# Patient Record
Sex: Male | Born: 1960 | Race: White | Hispanic: No | Marital: Married | Smoking: Never smoker
Health system: Southern US, Community
[De-identification: ages and names within clinical notes are randomized; demographics above are authoritative.]

## PROBLEM LIST (undated history)

## (undated) DIAGNOSIS — I10 Essential (primary) hypertension: Secondary | ICD-10-CM

---

## 2021-07-01 ENCOUNTER — Inpatient Hospital Stay (HOSPITAL_COMMUNITY): Payer: BLUE CROSS/BLUE SHIELD

## 2021-07-01 ENCOUNTER — Emergency Department (HOSPITAL_COMMUNITY): Payer: BLUE CROSS/BLUE SHIELD

## 2021-07-01 ENCOUNTER — Other Ambulatory Visit: Payer: Self-pay

## 2021-07-01 ENCOUNTER — Inpatient Hospital Stay (HOSPITAL_COMMUNITY)
Admission: EM | Admit: 2021-07-01 | Discharge: 2021-07-03 | DRG: 065 | Disposition: A | Discharge status: Home / Self Care | Payer: BLUE CROSS/BLUE SHIELD | Attending: Internal Medicine | Admitting: Internal Medicine

## 2021-07-01 ENCOUNTER — Encounter (HOSPITAL_COMMUNITY): Payer: Self-pay | Admitting: Internal Medicine

## 2021-07-01 DIAGNOSIS — R29810 Facial weakness: Secondary | ICD-10-CM | POA: Diagnosis present

## 2021-07-01 DIAGNOSIS — I63531 Cerebral infarction due to unspecified occlusion or stenosis of right posterior cerebral artery: Secondary | ICD-10-CM | POA: Diagnosis not present

## 2021-07-01 DIAGNOSIS — R471 Dysarthria and anarthria: Secondary | ICD-10-CM | POA: Diagnosis present

## 2021-07-01 DIAGNOSIS — I10 Essential (primary) hypertension: Secondary | ICD-10-CM | POA: Diagnosis not present

## 2021-07-01 DIAGNOSIS — D751 Secondary polycythemia: Secondary | ICD-10-CM | POA: Diagnosis not present

## 2021-07-01 DIAGNOSIS — G8194 Hemiplegia, unspecified affecting left nondominant side: Secondary | ICD-10-CM | POA: Diagnosis present

## 2021-07-01 DIAGNOSIS — R9389 Abnormal findings on diagnostic imaging of other specified body structures: Secondary | ICD-10-CM | POA: Diagnosis not present

## 2021-07-01 DIAGNOSIS — R29705 NIHSS score 5: Secondary | ICD-10-CM | POA: Diagnosis present

## 2021-07-01 DIAGNOSIS — I639 Cerebral infarction, unspecified: Secondary | ICD-10-CM | POA: Diagnosis present

## 2021-07-01 DIAGNOSIS — Z823 Family history of stroke: Secondary | ICD-10-CM

## 2021-07-01 DIAGNOSIS — E871 Hypo-osmolality and hyponatremia: Secondary | ICD-10-CM | POA: Diagnosis present

## 2021-07-01 DIAGNOSIS — Z79899 Other long term (current) drug therapy: Secondary | ICD-10-CM

## 2021-07-01 DIAGNOSIS — Z20822 Contact with and (suspected) exposure to covid-19: Secondary | ICD-10-CM | POA: Diagnosis present

## 2021-07-01 DIAGNOSIS — E785 Hyperlipidemia, unspecified: Secondary | ICD-10-CM | POA: Diagnosis present

## 2021-07-01 HISTORY — DX: Essential (primary) hypertension: I10

## 2021-07-01 LAB — HEMOGLOBIN A1C
Hgb A1c MFr Bld: 4.5 % — ABNORMAL LOW (ref 4.8–5.6)
Mean Plasma Glucose: 82.45 mg/dL

## 2021-07-01 LAB — LIPID PANEL
Cholesterol: 143 mg/dL (ref 0–200)
HDL: 25 mg/dL — ABNORMAL LOW (ref >40)
LDL Cholesterol: 88 mg/dL (ref 0–99)
Total CHOL/HDL Ratio: 5.7 RATIO
Triglycerides: 150 mg/dL — ABNORMAL HIGH (ref <150)
VLDL: 30 mg/dL (ref 0–40)

## 2021-07-01 LAB — URINALYSIS, ROUTINE W REFLEX MICROSCOPIC
Bilirubin Urine: NEGATIVE
Glucose, UA: NEGATIVE mg/dL
Hgb urine dipstick: NEGATIVE
Ketones, ur: NEGATIVE mg/dL
Leukocytes,Ua: NEGATIVE
Nitrite: NEGATIVE
Protein, ur: NEGATIVE mg/dL
Specific Gravity, Urine: 1.005 (ref 1.005–1.030)
pH: 7 (ref 5.0–8.0)

## 2021-07-01 LAB — I-STAT CHEM 8, ED
BUN: 18 mg/dL (ref 8–23)
Calcium, Ion: 1.13 mmol/L — ABNORMAL LOW (ref 1.15–1.40)
Chloride: 103 mmol/L (ref 98–111)
Creatinine, Ser: 1.1 mg/dL (ref 0.61–1.24)
Glucose, Bld: 91 mg/dL (ref 70–99)
HCT: 53 % — ABNORMAL HIGH (ref 39.0–52.0)
Hemoglobin: 18 g/dL — ABNORMAL HIGH (ref 13.0–17.0)
Potassium: 4.1 mmol/L (ref 3.5–5.1)
Sodium: 140 mmol/L (ref 135–145)
TCO2: 30 mmol/L (ref 22–32)

## 2021-07-01 LAB — COMPREHENSIVE METABOLIC PANEL
ALT: 43 U/L (ref 0–44)
AST: 40 U/L (ref 15–41)
Albumin: 4 g/dL (ref 3.5–5.0)
Alkaline Phosphatase: 70 U/L (ref 38–126)
Anion gap: 10 (ref 5–15)
BUN: 15 mg/dL (ref 8–23)
CO2: 26 mmol/L (ref 22–32)
Calcium: 9.3 mg/dL (ref 8.9–10.3)
Chloride: 102 mmol/L (ref 98–111)
Creatinine, Ser: 1.21 mg/dL (ref 0.61–1.24)
GFR, Estimated: >60 mL/min (ref >60)
Glucose, Bld: 94 mg/dL (ref 70–99)
Potassium: 4.1 mmol/L (ref 3.5–5.1)
Sodium: 138 mmol/L (ref 135–145)
Total Bilirubin: 1.4 mg/dL — ABNORMAL HIGH (ref 0.3–1.2)
Total Protein: 7.1 g/dL (ref 6.5–8.1)

## 2021-07-01 LAB — PROTIME-INR
INR: 1.1 (ref 0.8–1.2)
Prothrombin Time: 13.7 seconds (ref 11.4–15.2)

## 2021-07-01 LAB — RESP PANEL BY RT-PCR (FLU A&B, COVID) ARPGX2
Influenza A by PCR: NEGATIVE
Influenza B by PCR: NEGATIVE
SARS Coronavirus 2 by RT PCR: NEGATIVE

## 2021-07-01 LAB — DIFFERENTIAL
Abs Immature Granulocytes: 0.02 10*3/uL (ref 0.00–0.07)
Basophils Absolute: 0.1 10*3/uL (ref 0.0–0.1)
Basophils Relative: 1 %
Eosinophils Absolute: 0.4 10*3/uL (ref 0.0–0.5)
Eosinophils Relative: 5 %
Immature Granulocytes: 0 %
Lymphocytes Relative: 31 %
Lymphs Abs: 2.1 10*3/uL (ref 0.7–4.0)
Monocytes Absolute: 0.5 10*3/uL (ref 0.1–1.0)
Monocytes Relative: 7 %
Neutro Abs: 3.7 10*3/uL (ref 1.7–7.7)
Neutrophils Relative %: 56 %

## 2021-07-01 LAB — ETHANOL: Alcohol, Ethyl (B): <10 mg/dL (ref <10)

## 2021-07-01 LAB — RAPID URINE DRUG SCREEN, HOSP PERFORMED
Amphetamines: NOT DETECTED
Barbiturates: NOT DETECTED
Benzodiazepines: NOT DETECTED
Cocaine: NOT DETECTED
Opiates: NOT DETECTED
Tetrahydrocannabinol: NOT DETECTED

## 2021-07-01 LAB — CBC
HCT: 54 % — ABNORMAL HIGH (ref 39.0–52.0)
Hemoglobin: 18.4 g/dL — ABNORMAL HIGH (ref 13.0–17.0)
MCH: 31.3 pg (ref 26.0–34.0)
MCHC: 34.1 g/dL (ref 30.0–36.0)
MCV: 92 fL (ref 80.0–100.0)
Platelets: 174 10*3/uL (ref 150–400)
RBC: 5.87 MIL/uL — ABNORMAL HIGH (ref 4.22–5.81)
RDW: 12.9 % (ref 11.5–15.5)
WBC: 6.6 10*3/uL (ref 4.0–10.5)
nRBC: 0 % (ref 0.0–0.2)

## 2021-07-01 LAB — CBG MONITORING, ED: Glucose-Capillary: 92 mg/dL (ref 70–99)

## 2021-07-01 LAB — APTT: aPTT: 33 seconds (ref 24–36)

## 2021-07-01 IMAGING — CT CT ANGIO CHEST
2 of 6 series · 19 of 36 positions shown · IV contrast (omnipaque)
Comparison: None.

CLINICAL DATA: Pulmonary embolism suspected, high probability.

EXAM:
CT ANGIOGRAPHY CHEST WITH CONTRAST
TECHNIQUE: Multidetector CT imaging of the chest was performed using the
standard protocol during bolus administration of intravenous
contrast. Multiplanar CT image reconstructions and MIPs were
obtained to evaluate the vascular anatomy.
CONTRAST:  100mL OMNIPAQUE IOHEXOL 350 MG/ML SOLN

[Series 7: pe thins · axial · 0.88mm/px · z∈[+1135,+1385]mm · 18 of 396 slices shown]
[im 20/396  lung]
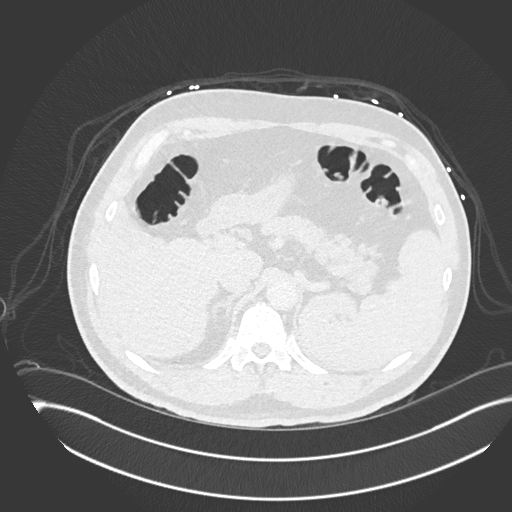
[im 40/396  mediastinal]
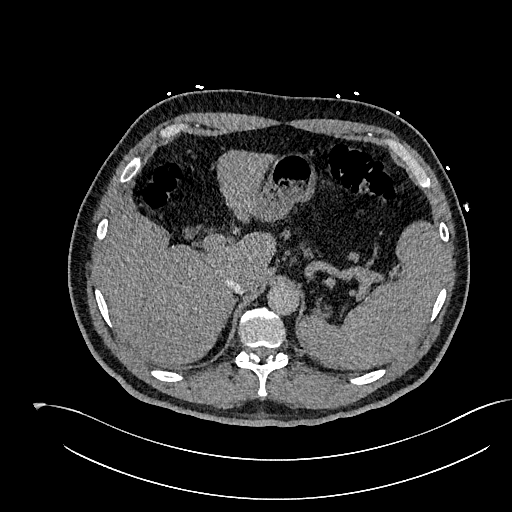
[im 60/396  lung]
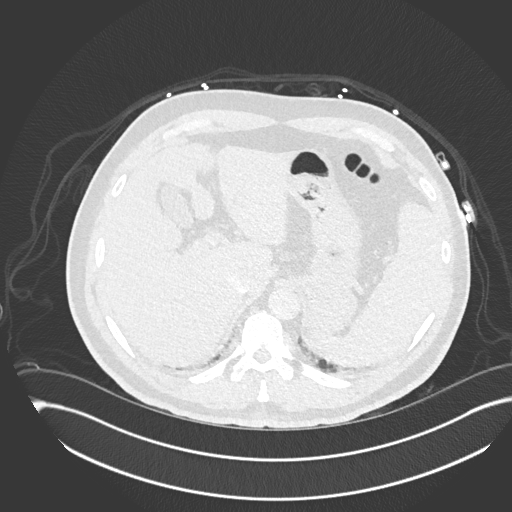
[im 80/396  mediastinal]
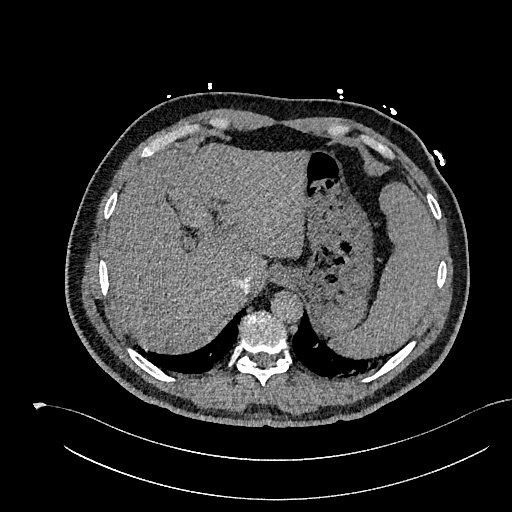
[im 99/396  lung]
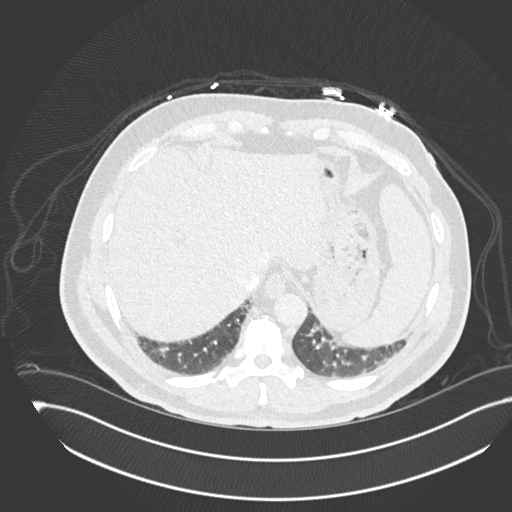
[im 119/396  mediastinal]
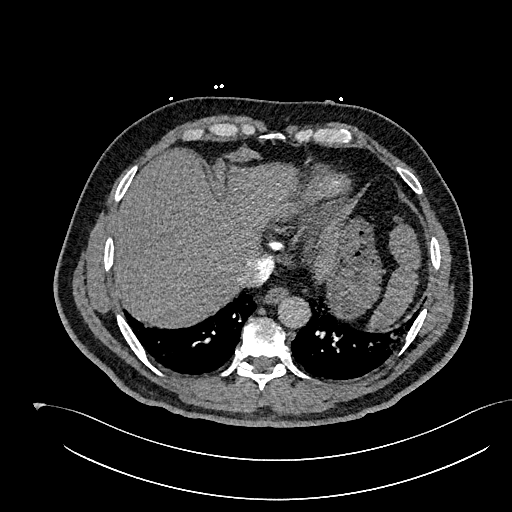
[im 139/396  lung]
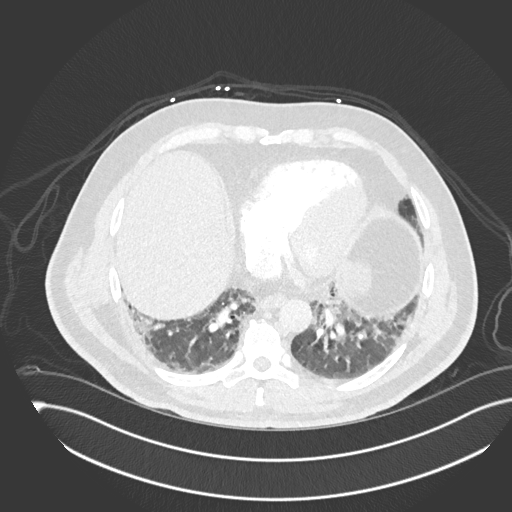
[im 159/396  mediastinal]
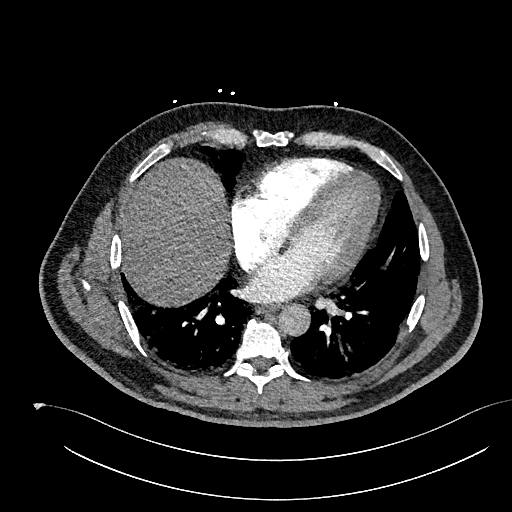
[im 178/396  lung]
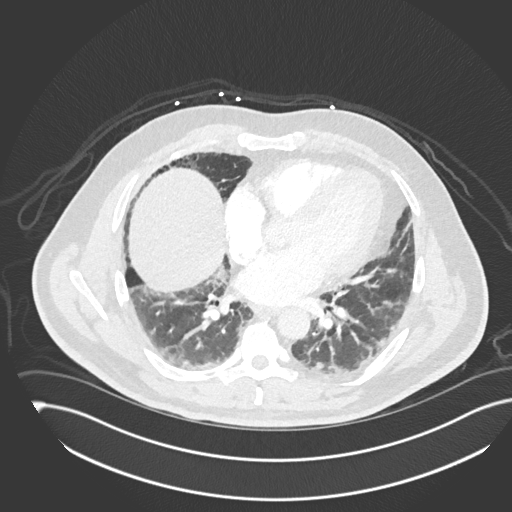
[im 218/396  mediastinal]
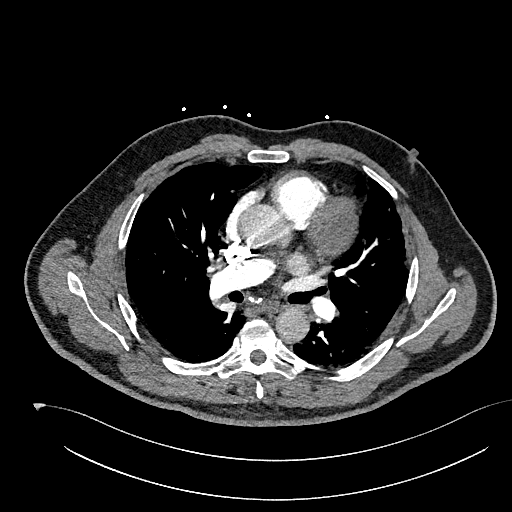
[im 238/396  lung]
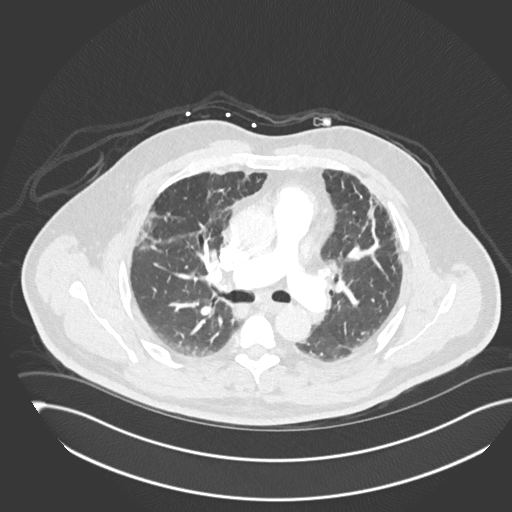
[im 257/396  mediastinal]
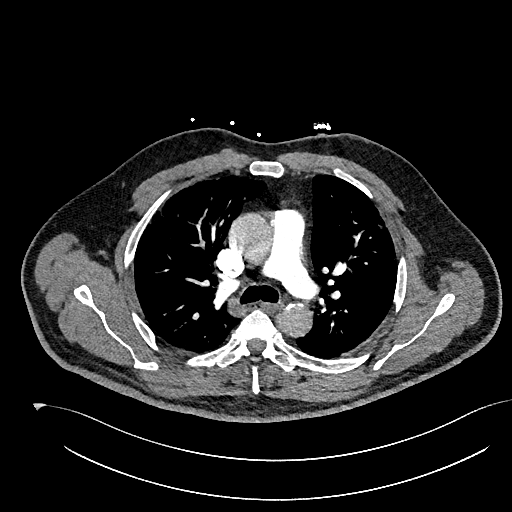
[im 277/396  lung]
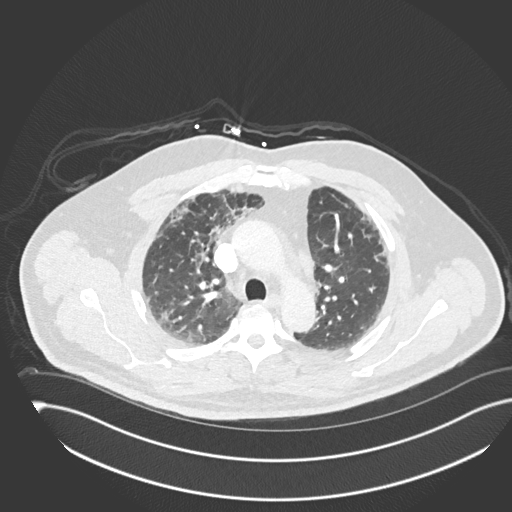
[im 297/396  mediastinal]
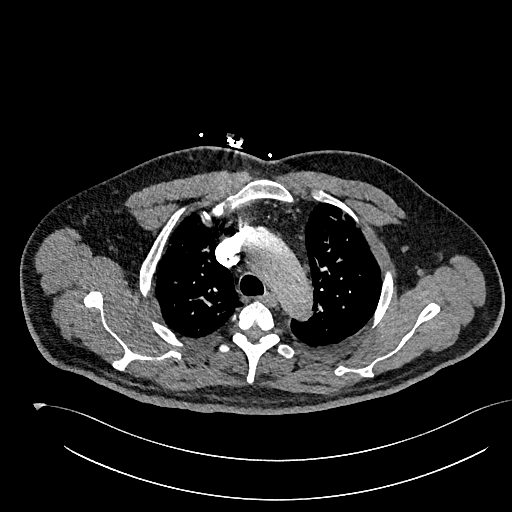
[im 317/396  lung]
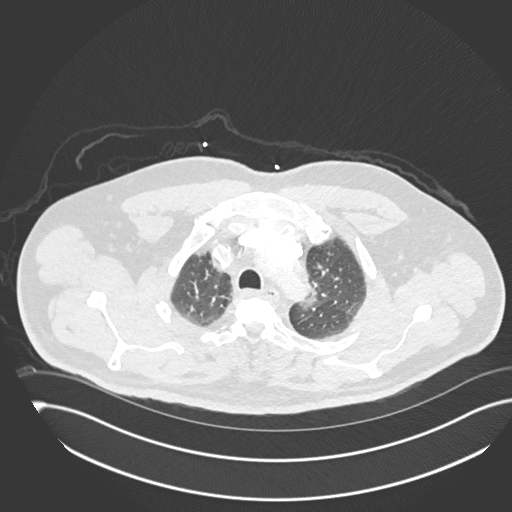
[im 336/396  mediastinal]
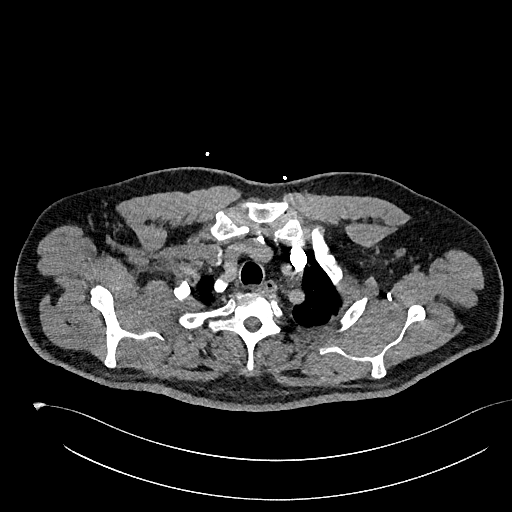
[im 356/396  lung]
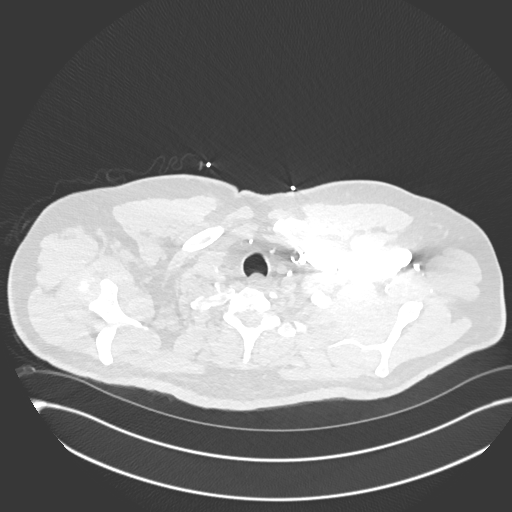
[im 376/396  mediastinal]
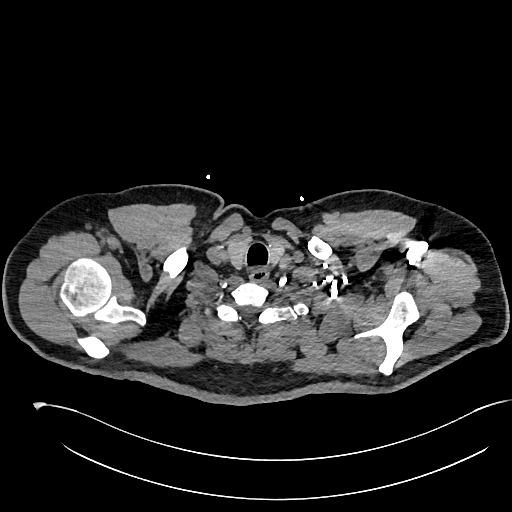

[Series 8: pe 2mm cor · coronal · 0.56mm/px · 1 of 151 slices shown]
[im 76/151  mediastinal]
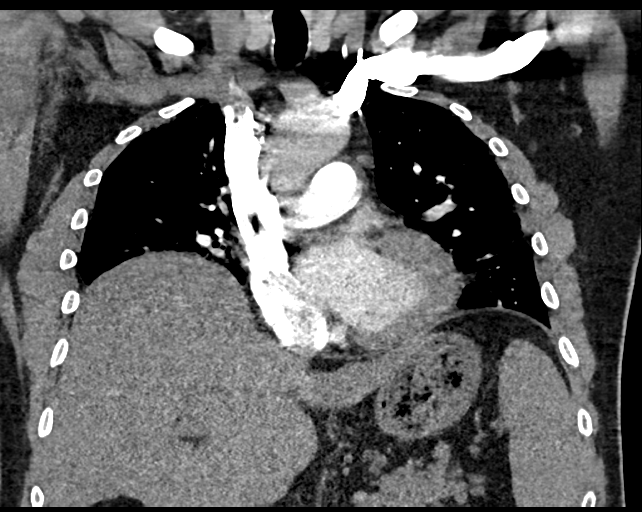

[19 of 36 positions shown; findings below may reference images not displayed]

FINDINGS: Cardiovascular: The heart is normal in size and there is no
pericardial effusion. Mild aortic atherosclerosis without evidence
of aneurysm. The pulmonary trunk is normal in caliber. No definite
evidence of pulmonary embolism. Evaluation of the segmental arteries
is limited due to mixing artifact and respiratory motion.

Mediastinum/Nodes: Calcified nonenlarged lymph nodes are present in
the mediastinum and right hilum. No axillary lymphadenopathy. The
thyroid gland, trachea, and esophagus are within normal limits.

Lungs/Pleura: Scattered ground-glass opacities are noted in the
lungs with a peripheral predominance. No effusion or pneumothorax.

Upper Abdomen: The spleen is enlarged measuring 14.1 cm in length.

Musculoskeletal: Mild degenerative changes in the thoracic spine. No
acute osseous abnormality.

Review of the MIP images confirms the above findings.
IMPRESSION: 1. No definite evidence of pulmonary embolism.
2. Scattered ground-glass opacities in the lungs with a peripheral
predominance, possible infectious or inflammatory pneumonitis.
3. Mild splenomegaly.

## 2021-07-01 IMAGING — MR MR MRA NECK W/O CM
1 of 3 series · 18 of 48 positions shown · non-contrast
Comparison: CT head from the same day.

CLINICAL DATA: Neuro deficit, acute, stroke suspected



[Series 4: sag inhance (id) · sagittal · 1.2mm · 0.47mm/px · 18 of 360 slices shown]
[im 1/360]
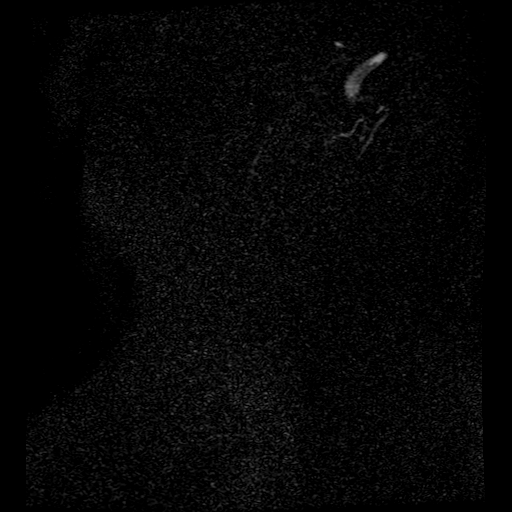
[im 12/360]
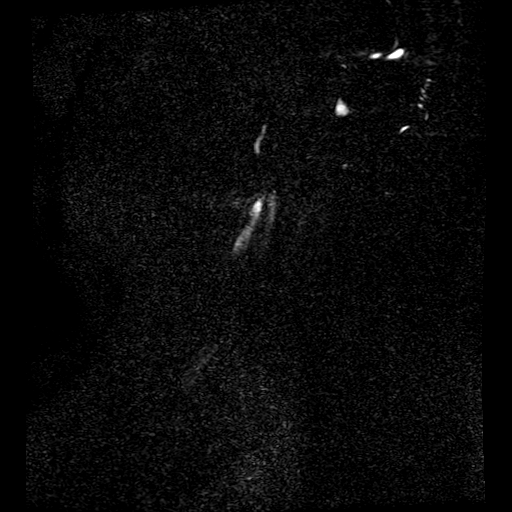
[im 23/360]
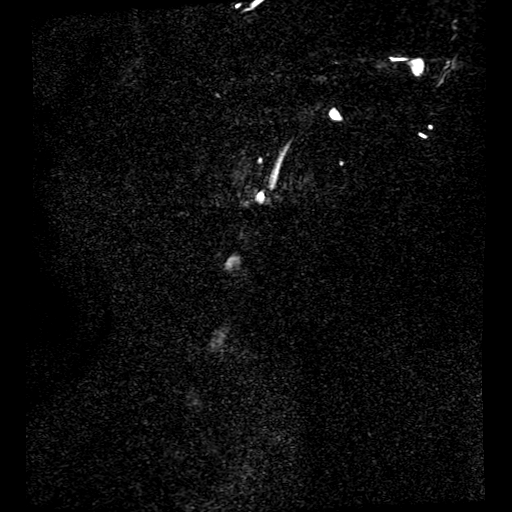
[im 34/360]
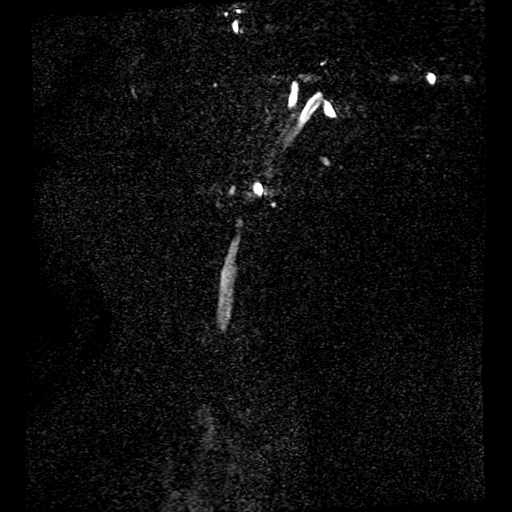
[im 45/360]
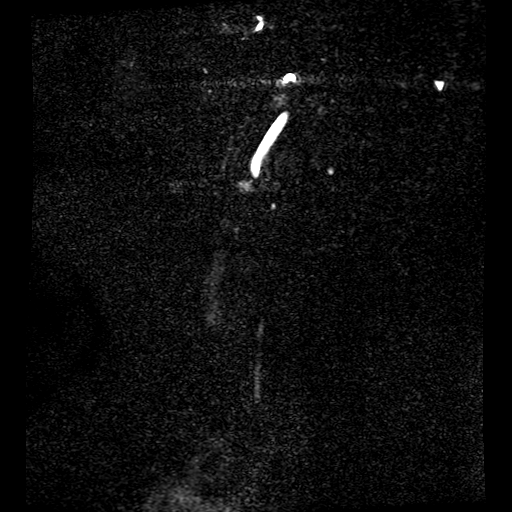
[im 57/360]
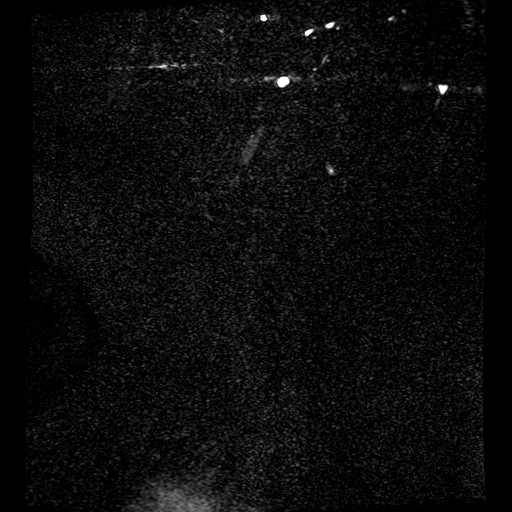
[im 68/360]
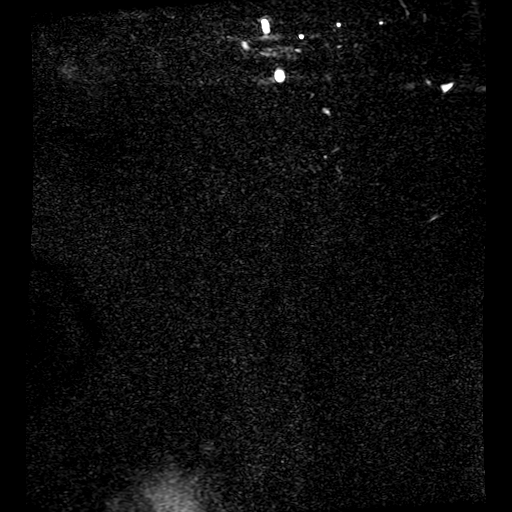
[im 79/360]
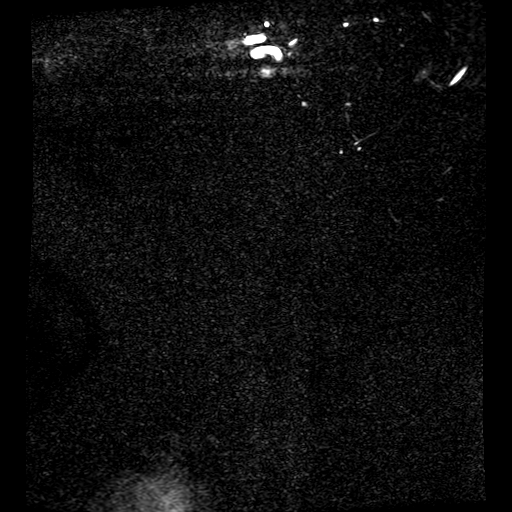
[im 90/360]
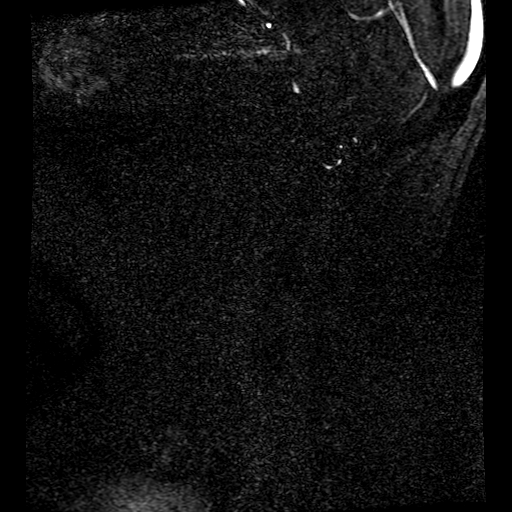
[im 101/360]
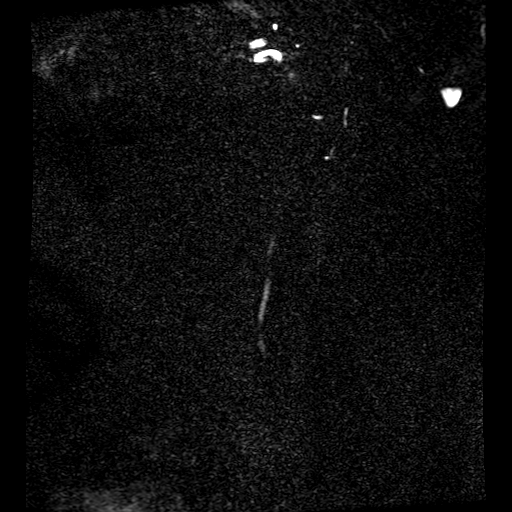
[im 113/360]
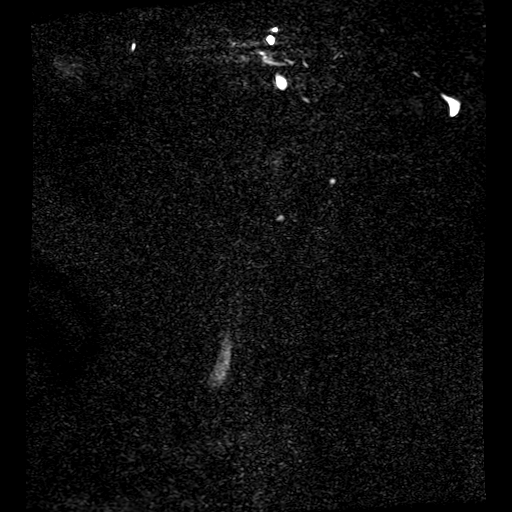
[im 158/360]
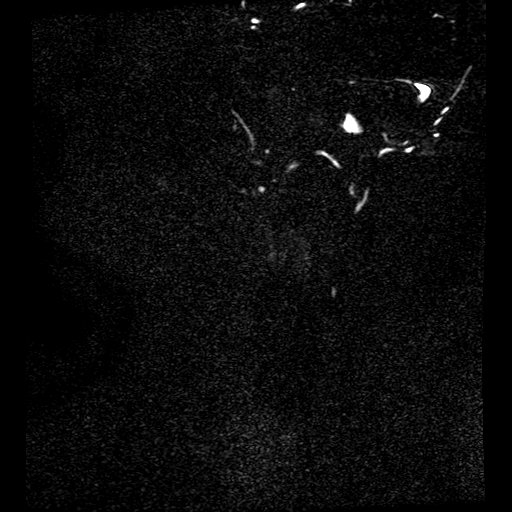
[im 180/360]
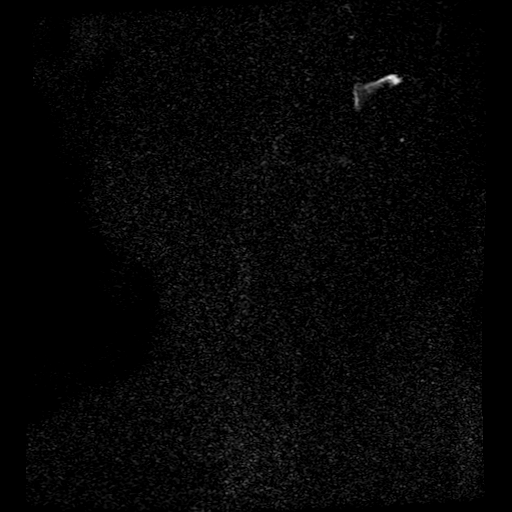
[im 202/360]
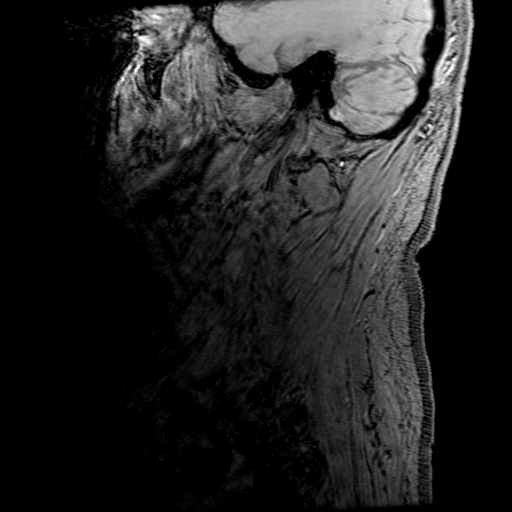
[im 247/360]
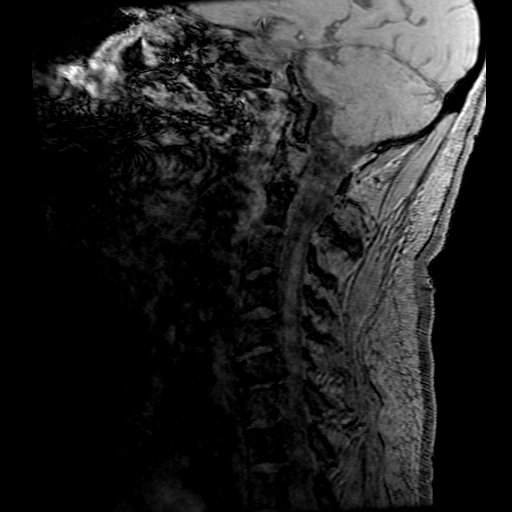
[im 292/360]
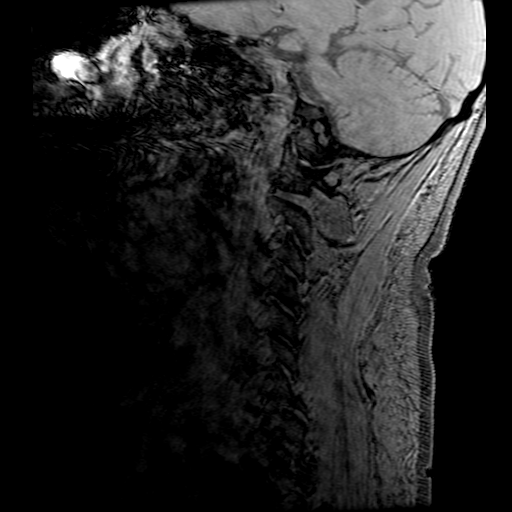
[im 303/360]
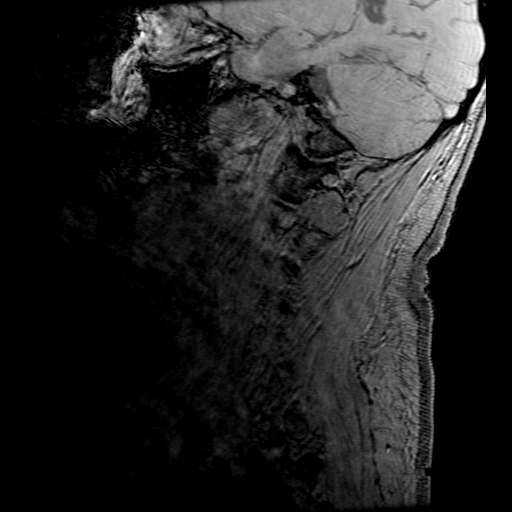
[im 337/360]
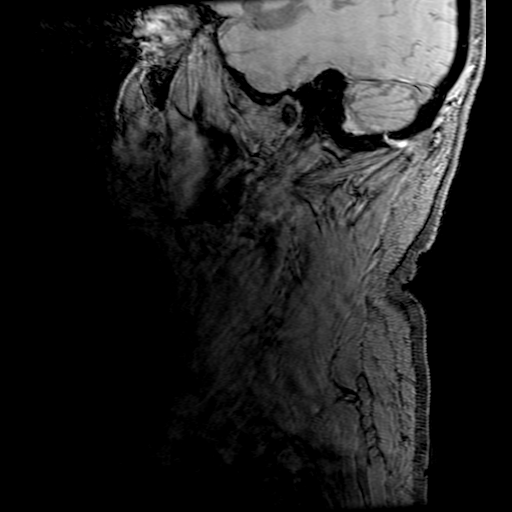

[18 of 48 positions shown; findings below may reference images not displayed]

FINDINGS: MRI HEAD FINDINGS

Brain: Multiple small acute infarcts within the right PCA territory,
including the right thalamus and occipital lobe. No hydrocephalus,
acute hemorrhage, midline shift, mass lesion, or visible extra-axial
fluid collection mild atrophy.

Vascular: Detailed below.

Skull and upper cervical spine: Normal marrow signal.

Sinuses/Orbits: Mild paranasal sinus mucosal thickening.
Unremarkable orbits.

Other: No mastoid effusions.

MRA HEAD FINDINGS

Anterior circulation: Bilateral intracranial ICAs, MCAs, and ACAs
are patent without proximal hemodynamically significant stenosis

Posterior circulation: Bilateral intradural vertebral arteries and
basilar artery are patent without significant stenosis. Occlusion of
the right P1 PCA. Left fetal type PCA. Left PCA is patent without
proximal hemodynamically significant stenosis.

MRA NECK FINDINGS

Right carotid system: Nondiagnostic evaluation of the right common
carotid artery origin and proximal common carotid artery due to MRA
technique/artifact. No visible significant (greater than 50%)
stenosis. Approximately 2 mm inferiorly directed outpouching arising
from the supraclinoid right ICA, favor infundibulum over aneurysm
given small vessel which probably originates from the tip.

Left carotid system: Nondiagnostic evaluation of the left common
carotid artery origin and proximal common carotid artery due to MRA
technique/artifact. No visible significant (greater than 50%)
stenosis.

Vertebral arteries: Nondiagnostic evaluation of the proximal
vertebral arteries. Also, nondiagnostic evaluation of the vertebral
arteries at the C1-C2 level due to in plane flow. The visible
vertebral arteries are patent without significant (greater than 50%
stenosis.
IMPRESSION: MRA head:

1. Right P1 PCA occlusion.
2. Approximately 2 mm inferiorly directed outpouching arising from
the supraclinoid right ICA, favor infundibulum over aneurysm given
small vessel near the tip.

MRI:

Multiple small acute infarcts within the right PCA territory,
including the right thalamus and occipital lobe. Slight edema
without mass effect.

MRA neck:

No visible significant (greater than 50%) stenosis with
nondiagnostic evaluation proximally, detailed above.

Findings discussed with Dr. HERLIN via telephone at [DATE].

## 2021-07-01 IMAGING — CT CT HEAD CODE STROKE
3 series · 14 of 47 positions shown, 16 images · non-contrast
Comparison: None.

CLINICAL DATA: Code stroke.

EXAM:
CT HEAD WITHOUT CONTRAST
TECHNIQUE: Contiguous axial images were obtained from the base of the skull
through the vertex without intravenous contrast.

[Series 3: head 5.0 st · axial · 0.44mm/px · z∈[-16,+114]mm · 8 of 32 slices shown, 10 images]
[im 3/32  brain]
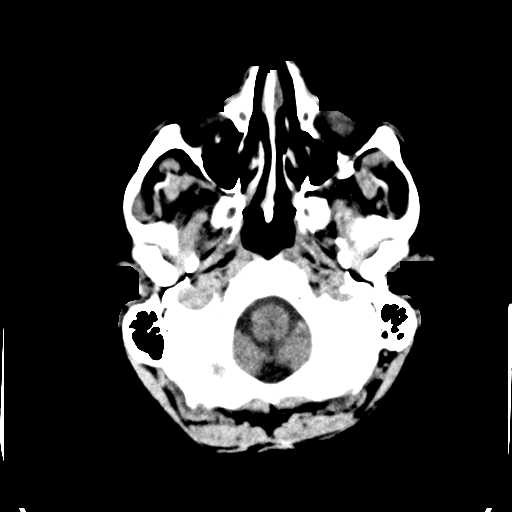
[im 3/32  bone]
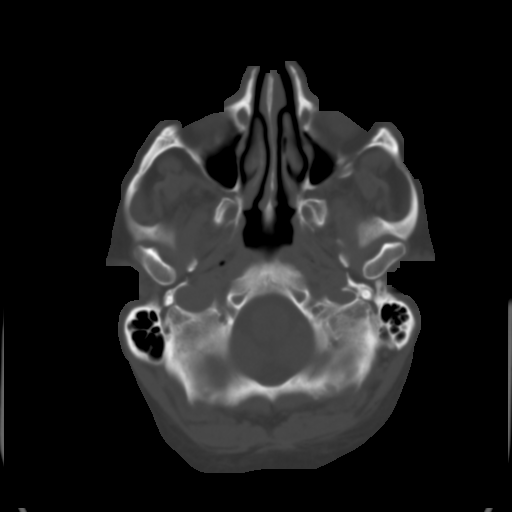
[im 7/32  brain]
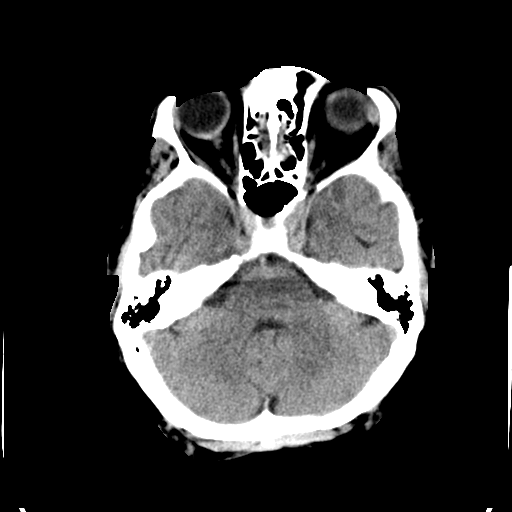
[im 10/32  brain]
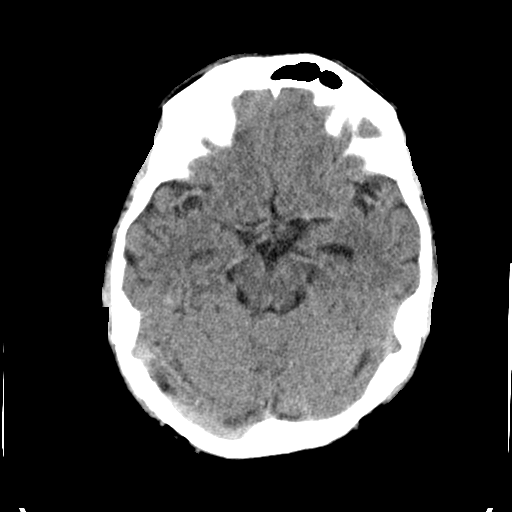
[im 14/32  brain]
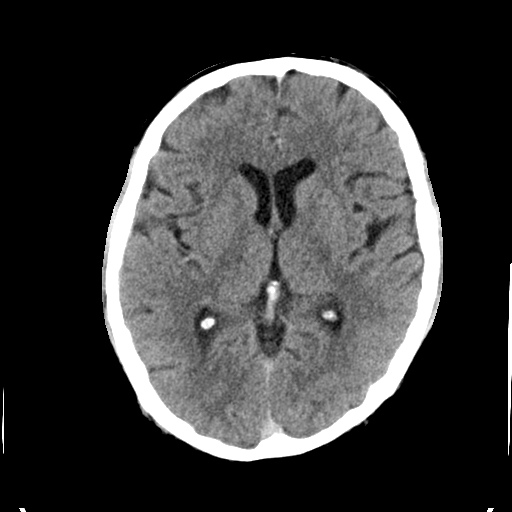
[im 18/32  brain]
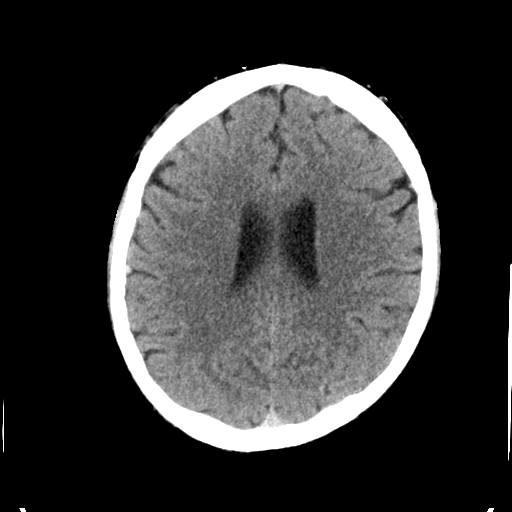
[im 18/32  bone]
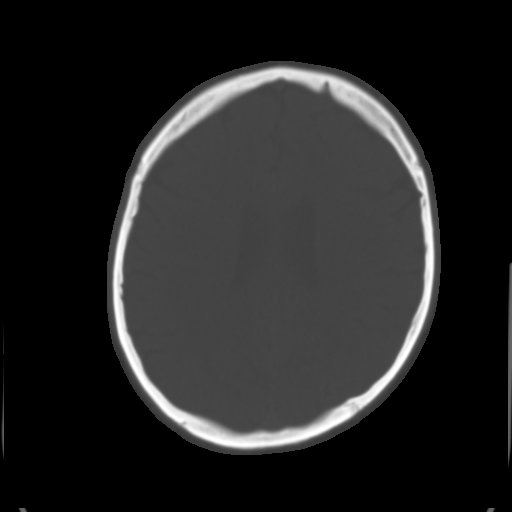
[im 22/32  brain]
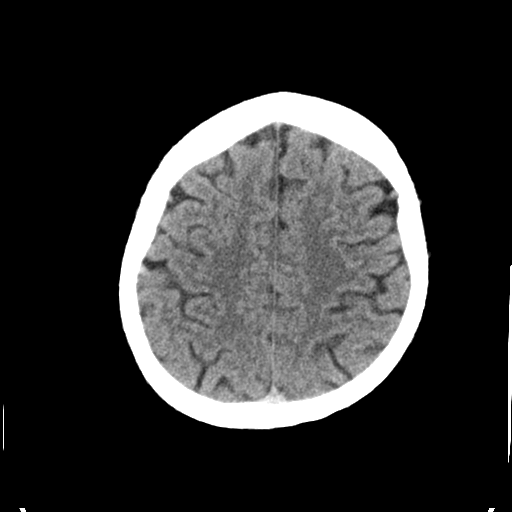
[im 25/32  brain]
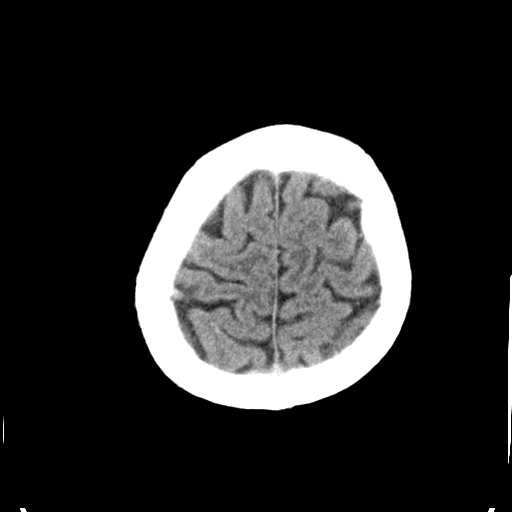
[im 29/32  brain]
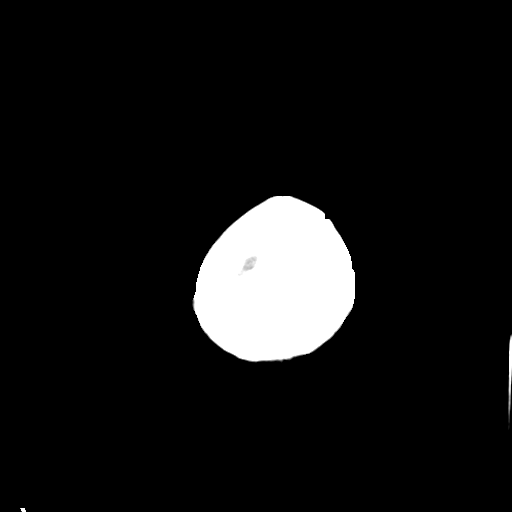

[Series 5: head 3.0 cor st · coronal · 0.36mm/px · 3 of 71 slices shown]
[im 24/71  brain]
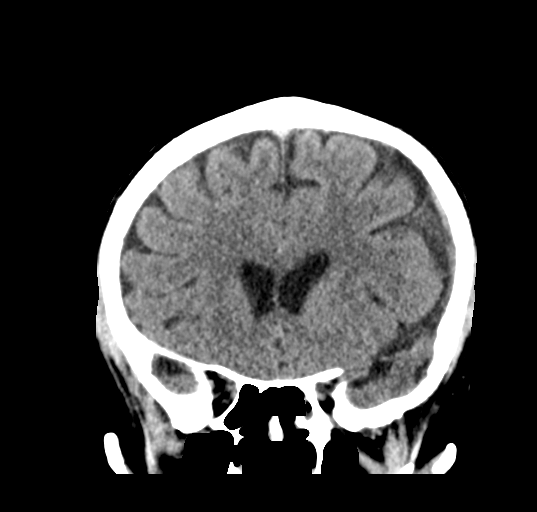
[im 32/71  brain]
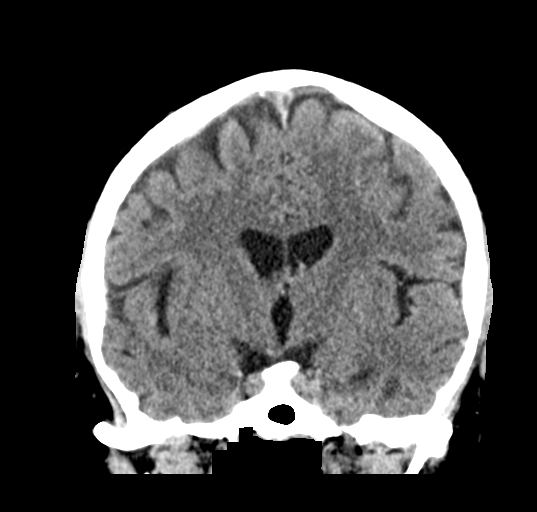
[im 39/71  brain]
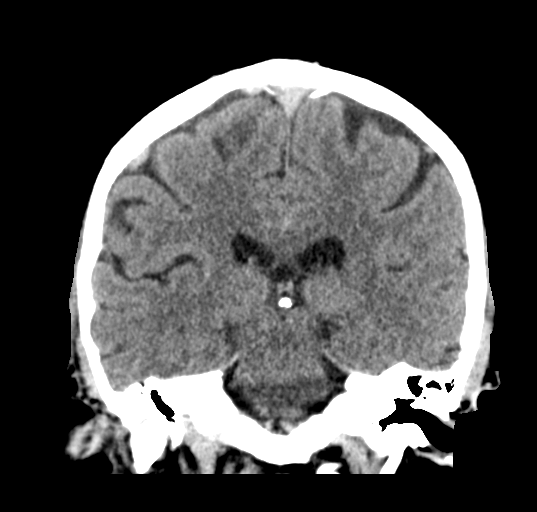

[Series 6: head 3.0 sag st · sagittal · 0.31mm/px · 3 of 67 slices shown]
[im 23/67  brain]
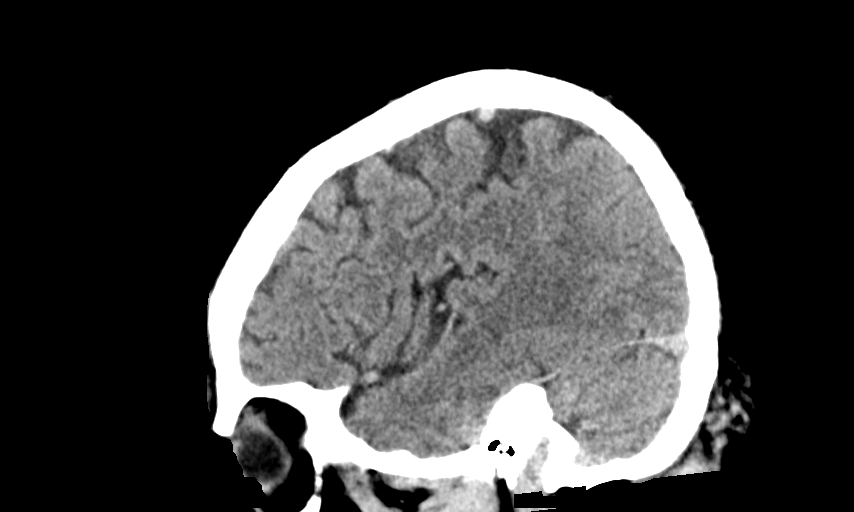
[im 34/67  brain]
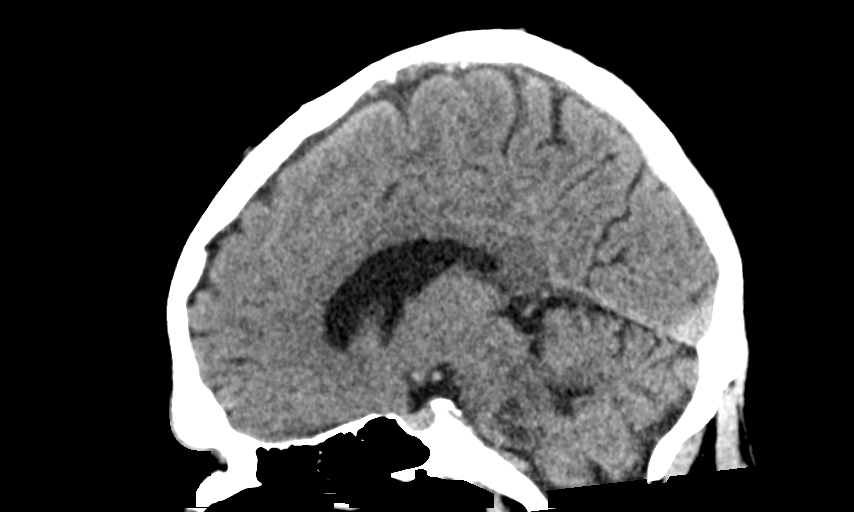
[im 45/67  brain]
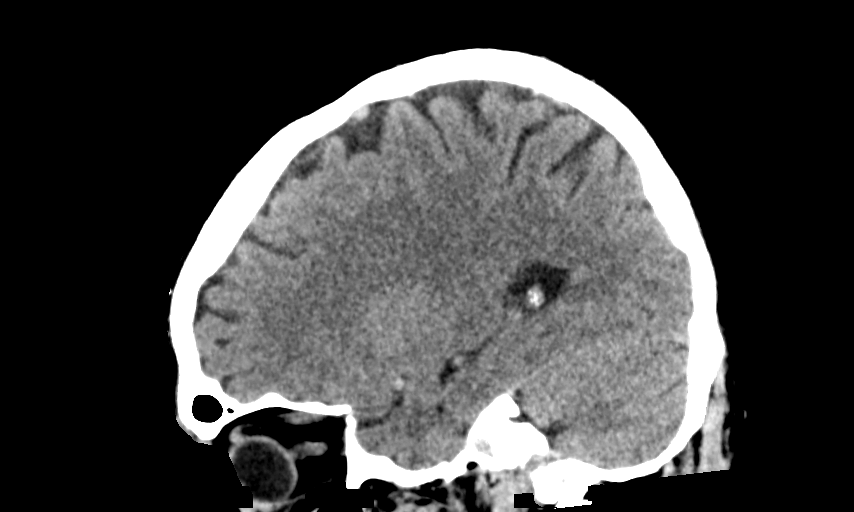

[14 of 47 positions shown; findings below may reference images not displayed]

FINDINGS: Brain: No evidence of acute infarction, hemorrhage, cerebral edema,
mass, mass effect, or midline shift. Ventricles and sulci are normal
for age. No extra-axial fluid collection.

Vascular: No hyperdense vessel or unexpected calcification.

Skull: Normal. Negative for fracture or focal lesion.

Mucosal thickening in the ethmoid air cells. Hypoplastic right
frontal sinus. The orbits are negative.: No acute finding.

Other: The mastoid air cells are well aerated.

ASPECTS (Alberta Stroke Program Early CT Score)

- Ganglionic level infarction (caudate, lentiform nuclei, internal
capsule, insula, M1-M3 cortex): 7

- Supraganglionic infarction (M4-M6 cortex): 3

Total score (0-10 with 10 being normal): 10
IMPRESSION: 1. No acute intracranial process.
2. ASPECTS is 10

Code stroke imaging results were communicated on [DATE] at [DATE]
to provider [[email or web address]] via secure text paging.

## 2021-07-01 IMAGING — DX DG CHEST 1V PORT
1 series · 1 of 1 positions shown · non-contrast
Comparison: None.

CLINICAL DATA: Dyspnea

EXAM:
PORTABLE CHEST 1 VIEW

[chest ap]
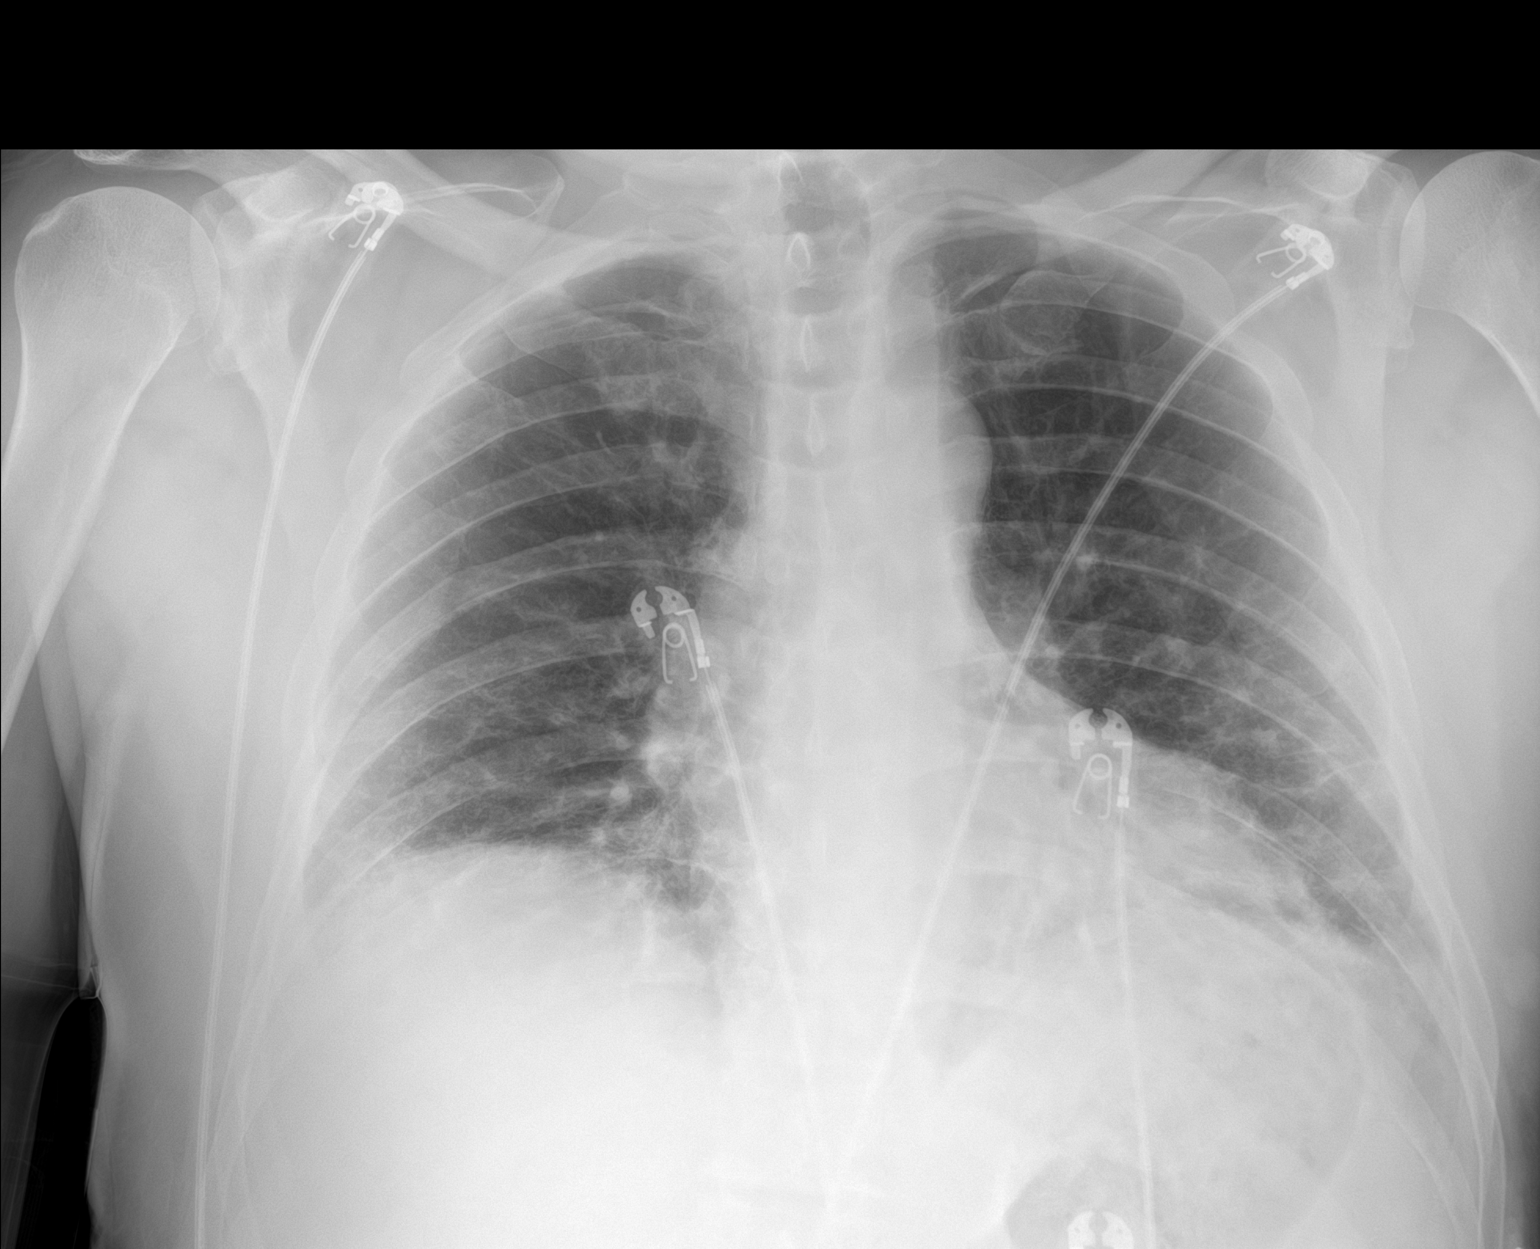

[1 of 1 positions shown; findings below may reference images not displayed]

FINDINGS: The lungs are symmetrically expanded. Asymmetric bibasilar
reticulonodular infiltrates are present, more severe at the left
lung base, likely infectious or inflammatory in the acute setting.
No pneumothorax or pleural effusion. Cardiac size within normal
limits. No acute bone abnormality.
IMPRESSION: Bibasilar reticulonodular infiltrates, likely infectious or
inflammatory. Follow-up examination in 3-4 weeks may be helpful in
documenting resolution. If persistent, these may be better assessed
with high-resolution CT examination at that point.

## 2021-07-01 IMAGING — MR MR MRA HEAD W/O CM
2 series · 18 of 48 positions shown · non-contrast
Comparison: CT head from the same day.

CLINICAL DATA: Neuro deficit, acute, stroke suspected



[Series 2: ax (id) · axial · 1.0mm · 0.43mm/px · z∈[-98,-6]mm · 17 of 210 slices shown]
[im 1/210]
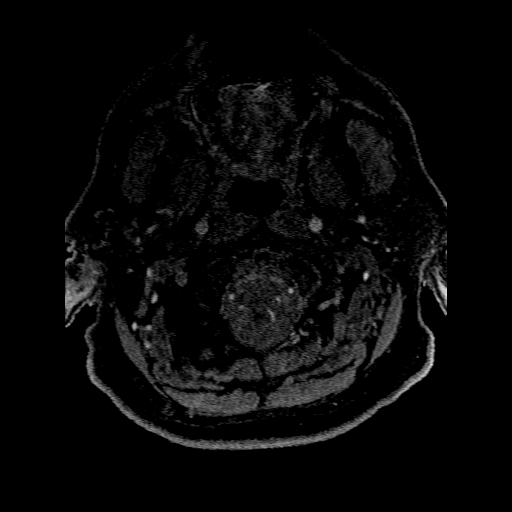
[im 5/210]
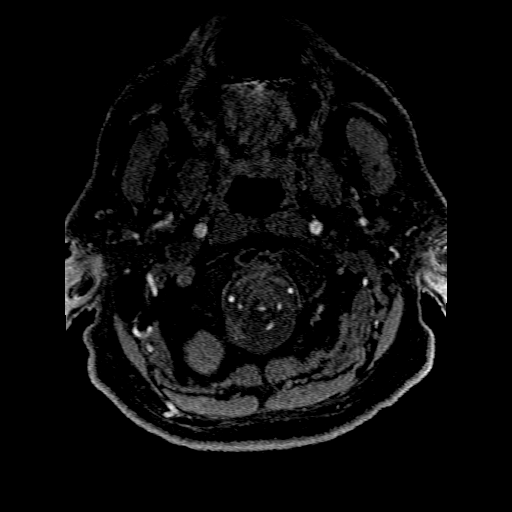
[im 10/210]
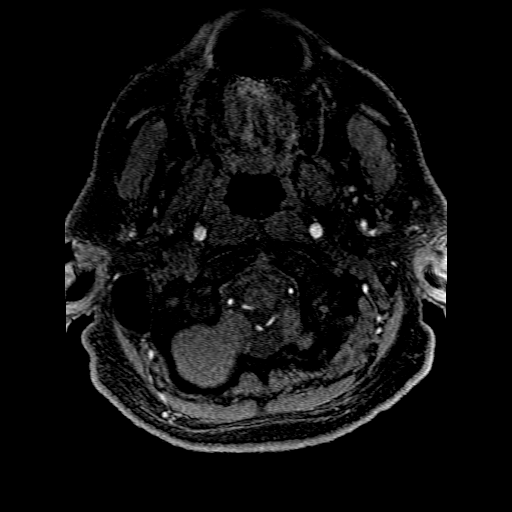
[im 14/210]
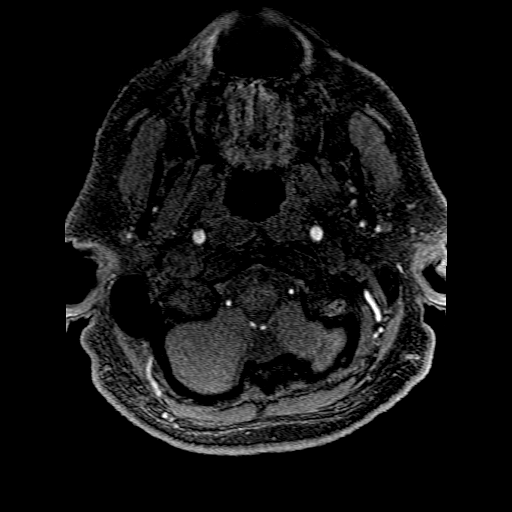
[im 19/210]
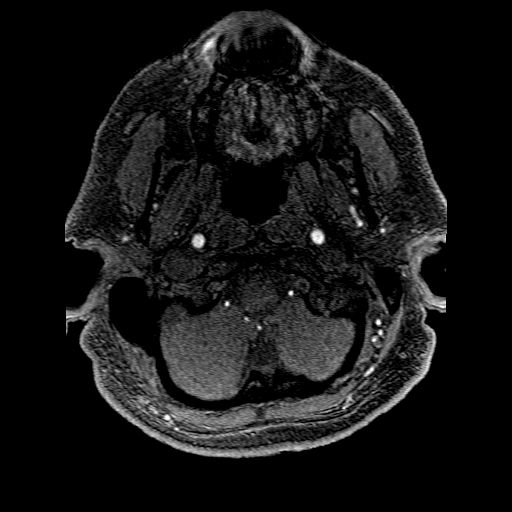
[im 23/210]
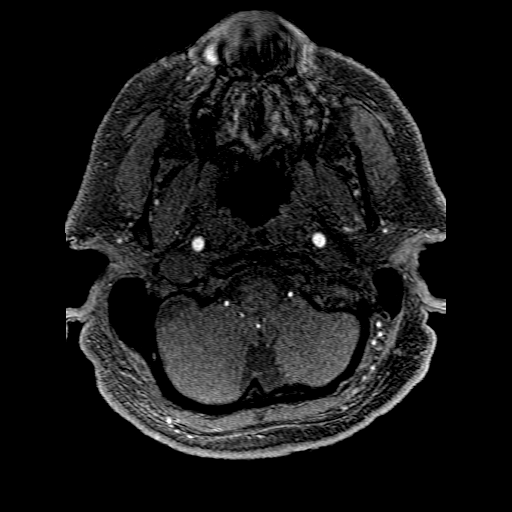
[im 28/210]
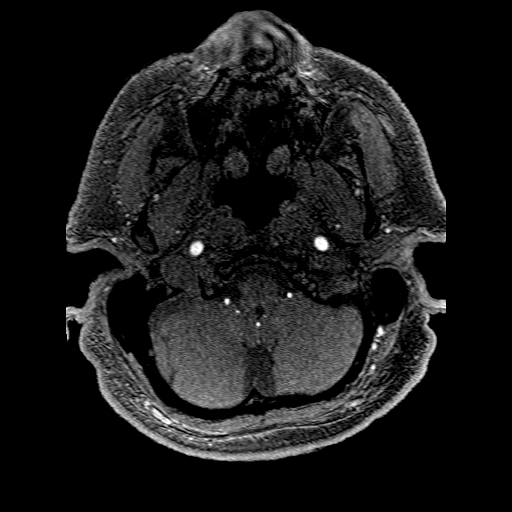
[im 32/210]
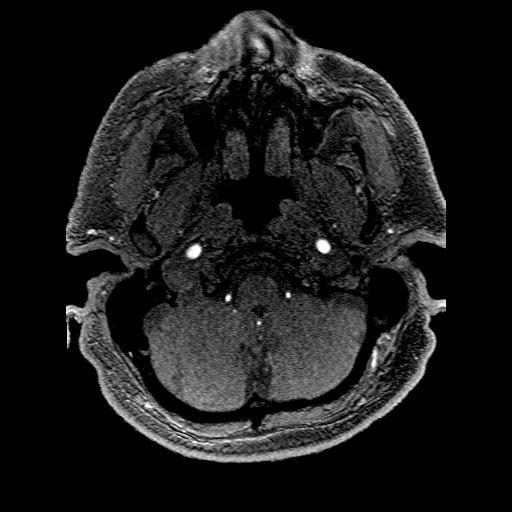
[im 37/210]
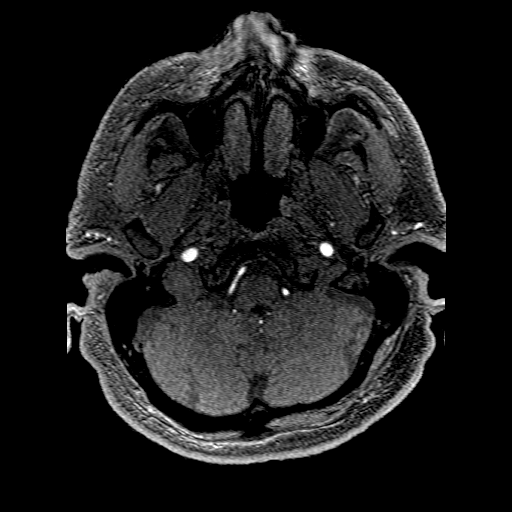
[im 64/210]
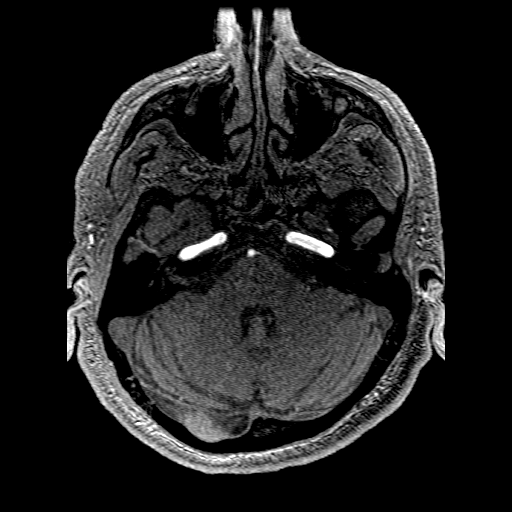
[im 91/210]
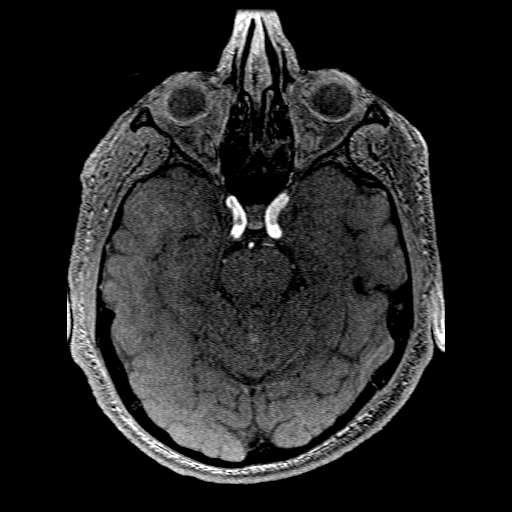
[im 105/210]
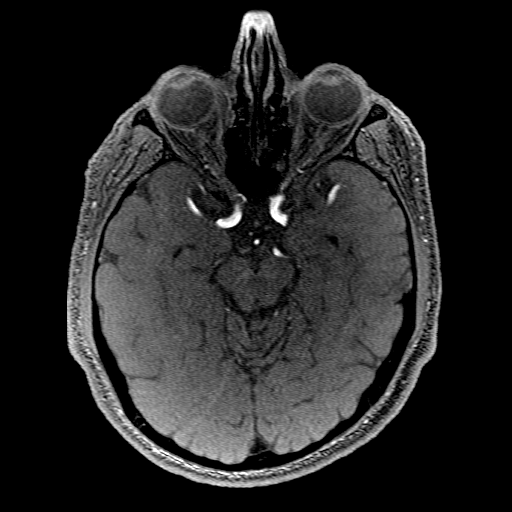
[im 119/210]
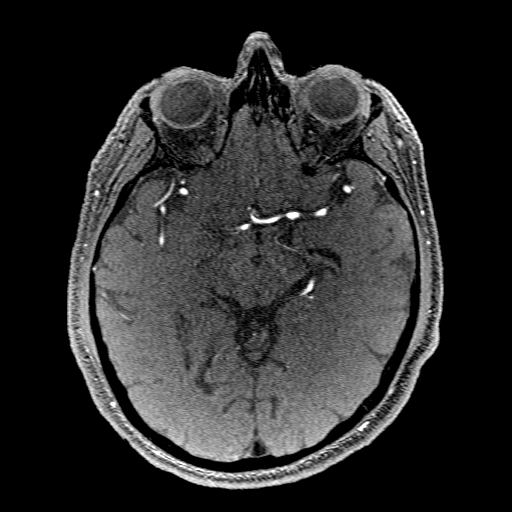
[im 146/210]
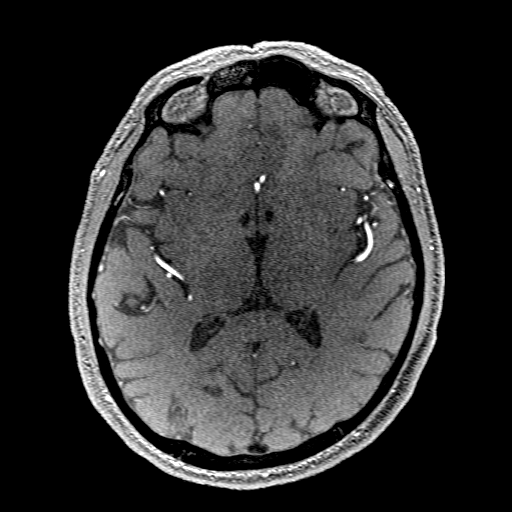
[im 173/210]
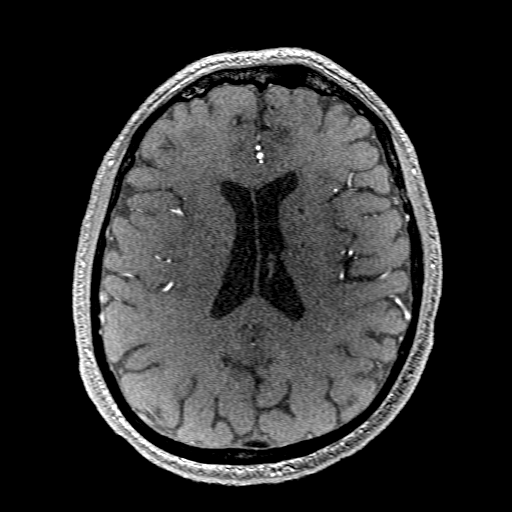
[im 178/210]
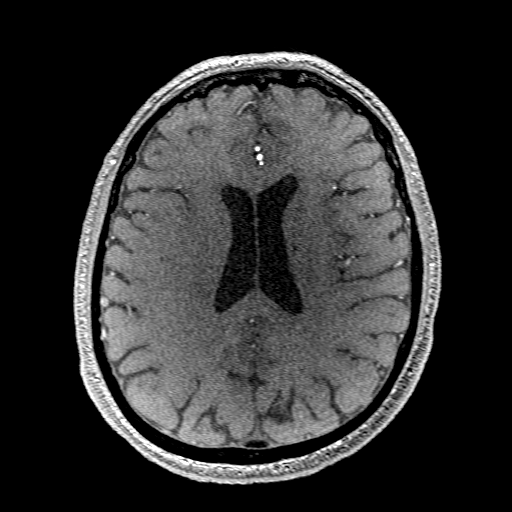
[im 200/210]
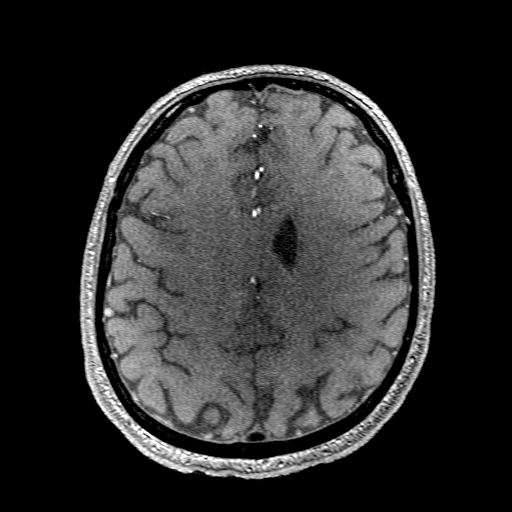

[Series 201: pjn:ax (id) · sagittal · 1.0mm · 0.43mm/px · 1 of 5 slices shown]
[im 1/5]
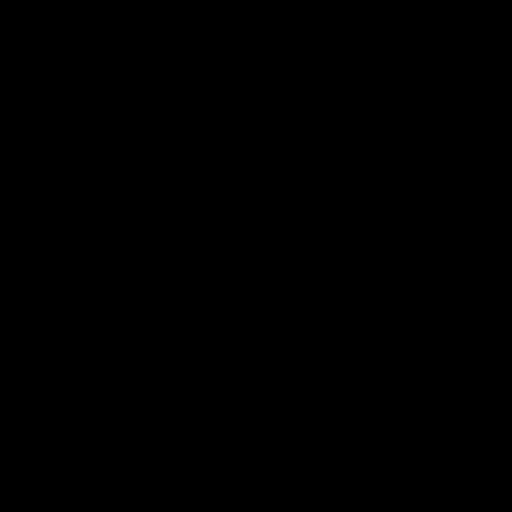

[18 of 48 positions shown; findings below may reference images not displayed]

FINDINGS: MRI HEAD FINDINGS

Brain: Multiple small acute infarcts within the right PCA territory,
including the right thalamus and occipital lobe. No hydrocephalus,
acute hemorrhage, midline shift, mass lesion, or visible extra-axial
fluid collection mild atrophy.

Vascular: Detailed below.

Skull and upper cervical spine: Normal marrow signal.

Sinuses/Orbits: Mild paranasal sinus mucosal thickening.
Unremarkable orbits.

Other: No mastoid effusions.

MRA HEAD FINDINGS

Anterior circulation: Bilateral intracranial ICAs, MCAs, and ACAs
are patent without proximal hemodynamically significant stenosis

Posterior circulation: Bilateral intradural vertebral arteries and
basilar artery are patent without significant stenosis. Occlusion of
the right P1 PCA. Left fetal type PCA. Left PCA is patent without
proximal hemodynamically significant stenosis.

MRA NECK FINDINGS

Right carotid system: Nondiagnostic evaluation of the right common
carotid artery origin and proximal common carotid artery due to MRA
technique/artifact. No visible significant (greater than 50%)
stenosis. Approximately 2 mm inferiorly directed outpouching arising
from the supraclinoid right ICA, favor infundibulum over aneurysm
given small vessel which probably originates from the tip.

Left carotid system: Nondiagnostic evaluation of the left common
carotid artery origin and proximal common carotid artery due to MRA
technique/artifact. No visible significant (greater than 50%)
stenosis.

Vertebral arteries: Nondiagnostic evaluation of the proximal
vertebral arteries. Also, nondiagnostic evaluation of the vertebral
arteries at the C1-C2 level due to in plane flow. The visible
vertebral arteries are patent without significant (greater than 50%
stenosis.
IMPRESSION: MRA head:

1. Right P1 PCA occlusion.
2. Approximately 2 mm inferiorly directed outpouching arising from
the supraclinoid right ICA, favor infundibulum over aneurysm given
small vessel near the tip.

MRI:

Multiple small acute infarcts within the right PCA territory,
including the right thalamus and occipital lobe. Slight edema
without mass effect.

MRA neck:

No visible significant (greater than 50%) stenosis with
nondiagnostic evaluation proximally, detailed above.

Findings discussed with Dr. HERLIN via telephone at [DATE].

## 2021-07-01 IMAGING — MR MR HEAD W/O CM
6 of 11 series · 24 of 48 positions shown · non-contrast
Comparison: CT head from the same day.

CLINICAL DATA: Neuro deficit, acute, stroke suspected



[Series 2: DWI · axial · 3.0mm · 0.94mm/px · z∈[-108,+55]mm · 7 of 115 slices shown (1 of 2)]
[im 1/115]
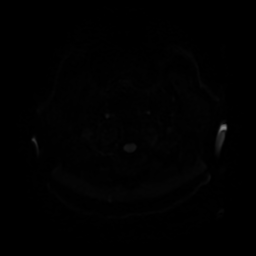
[im 20/115]
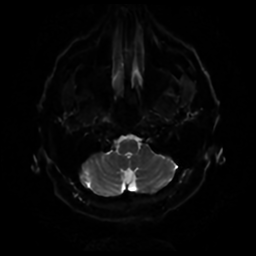
[im 39/115]
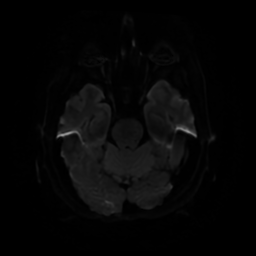
[im 58/115]
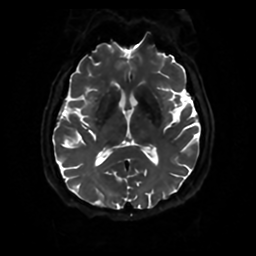
[im 77/115]
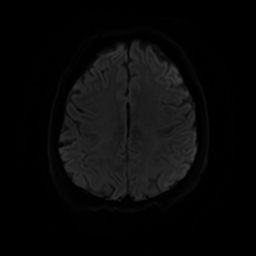
[im 96/115]
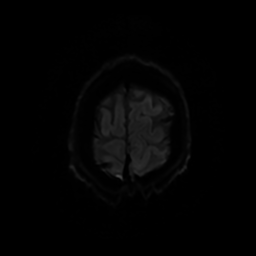
[im 115/115]
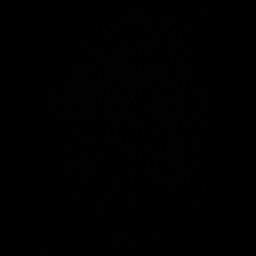

[Series 3: DWI · coronal · 4.0mm · 0.94mm/px · 5 of 74 slices shown (2 of 2)]
[im 1/74]
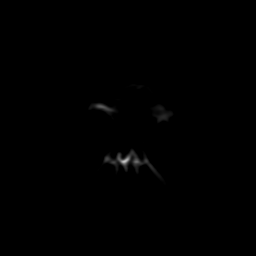
[im 19/74]
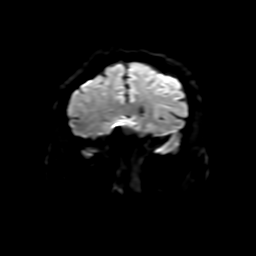
[im 37/74]
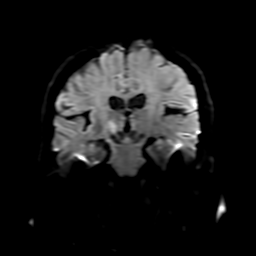
[im 55/74]
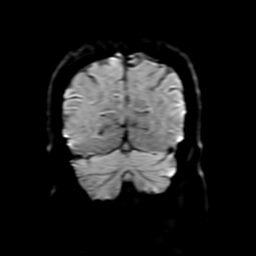
[im 74/74]
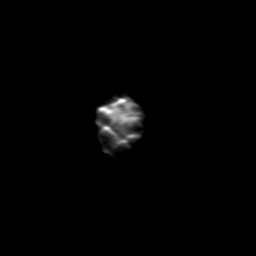

[Series 4: FLAIR · sagittal · 5.0mm · 0.23mm/px · 2 of 27 slices shown (1 of 2)]
[im 1/27]
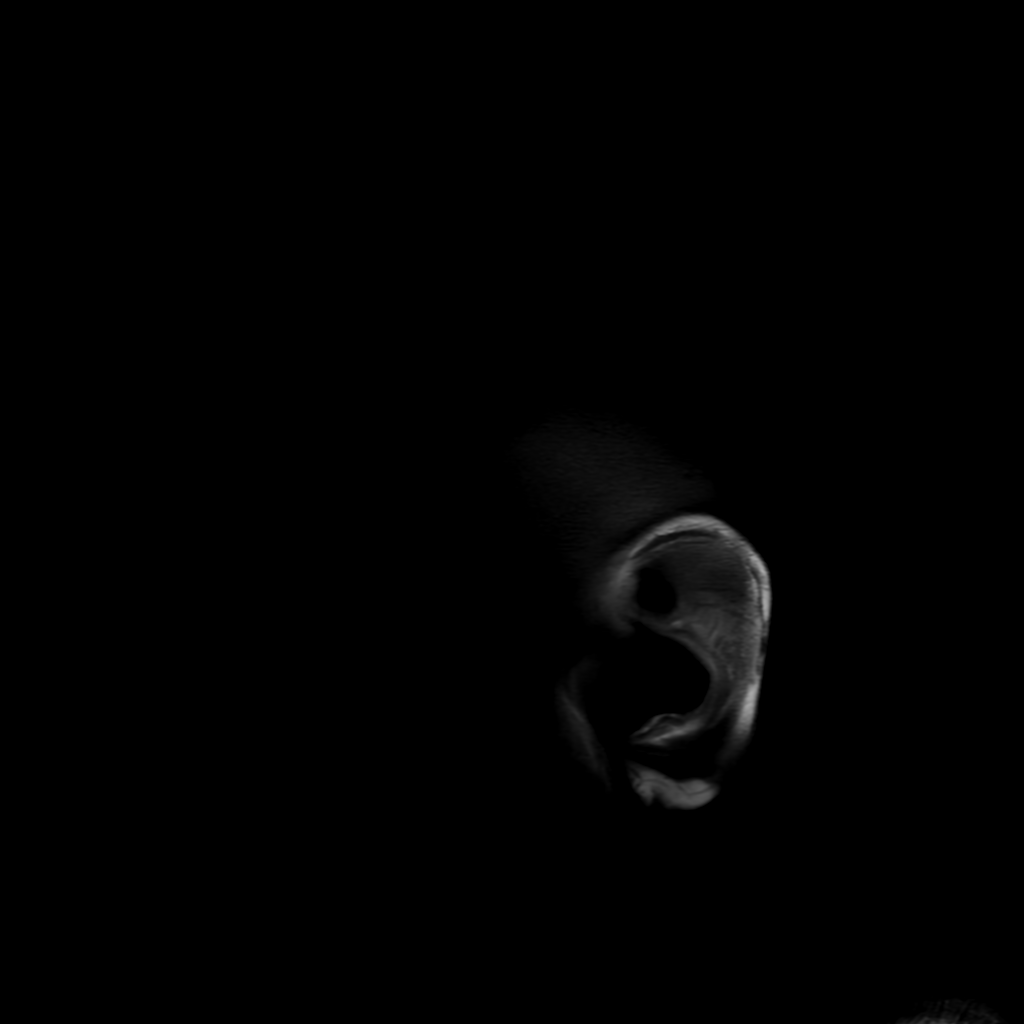
[im 27/27]
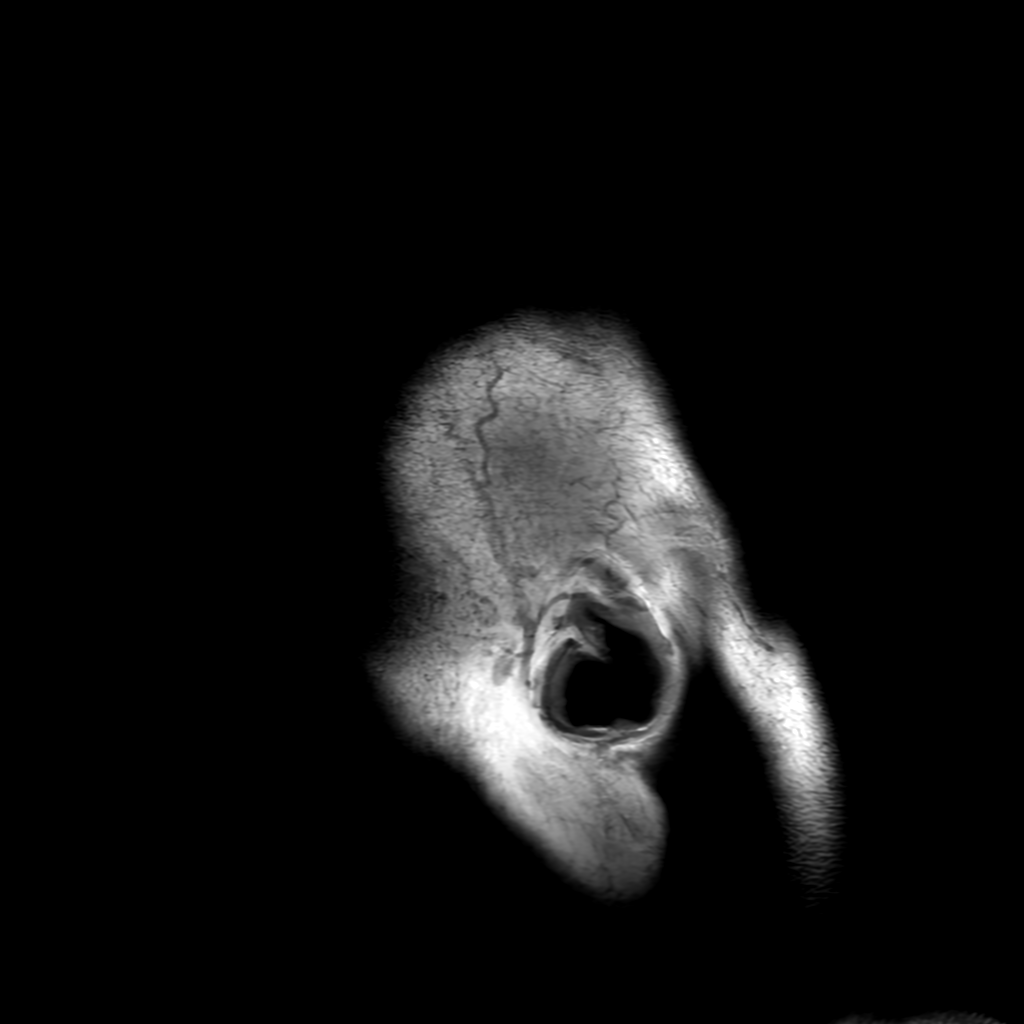

[Series 6: FLAIR · axial · 4.0mm · 0.45mm/px · z∈[-108,+56]mm · 3 of 40 slices shown (2 of 2)]
[im 1/40]
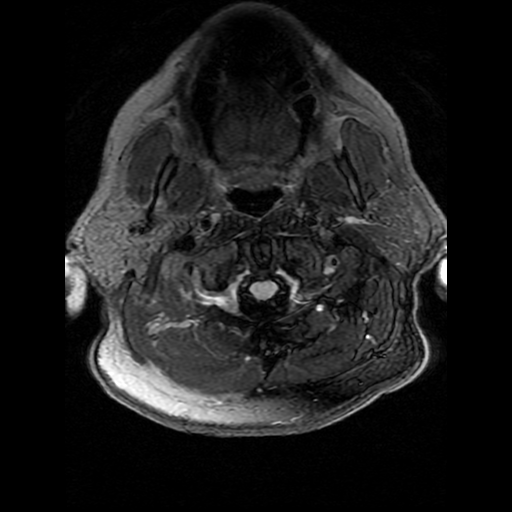
[im 20/40]
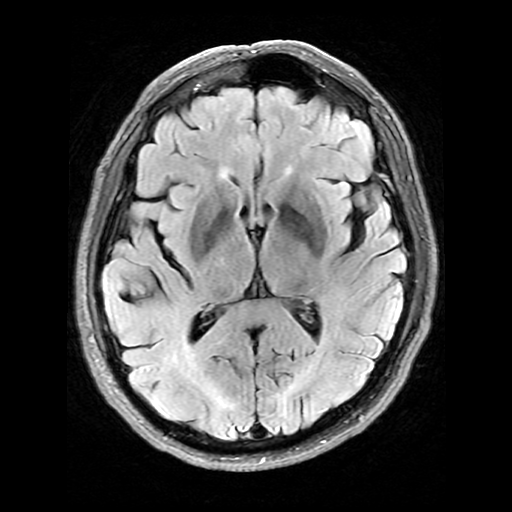
[im 40/40]
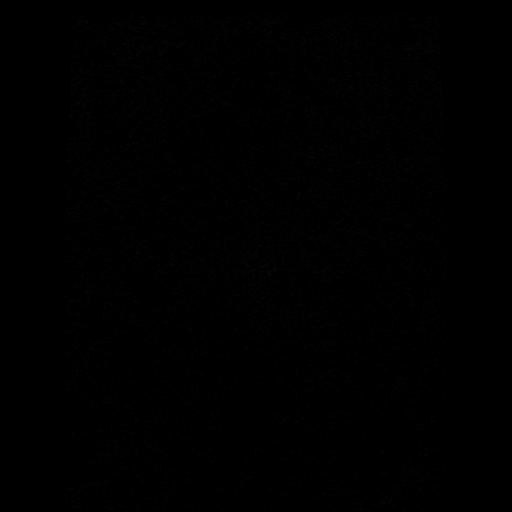

[Series 250: ADC · axial · 3.0mm · 0.94mm/px · z∈[-108,+55]mm · 4 of 58 slices shown (1 of 2)]
[im 1/58]
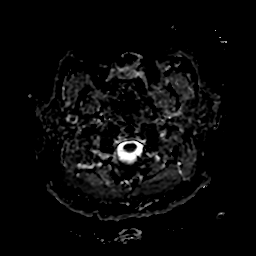
[im 20/58]
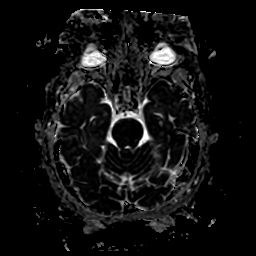
[im 39/58]
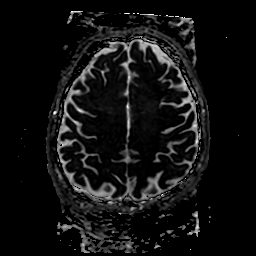
[im 58/58]
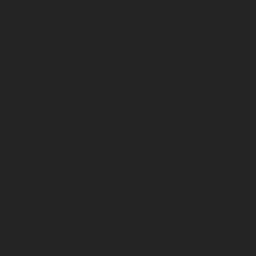

[Series 350: ADC · coronal · 4.0mm · 0.94mm/px · 3 of 37 slices shown (2 of 2)]
[im 1/37]
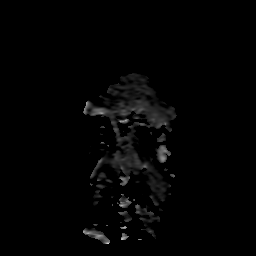
[im 19/37]
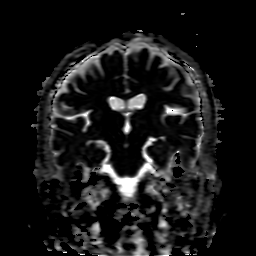
[im 37/37]
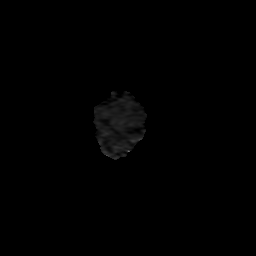

[24 of 48 positions shown; findings below may reference images not displayed]

FINDINGS: MRI HEAD FINDINGS

Brain: Multiple small acute infarcts within the right PCA territory,
including the right thalamus and occipital lobe. No hydrocephalus,
acute hemorrhage, midline shift, mass lesion, or visible extra-axial
fluid collection mild atrophy.

Vascular: Detailed below.

Skull and upper cervical spine: Normal marrow signal.

Sinuses/Orbits: Mild paranasal sinus mucosal thickening.
Unremarkable orbits.

Other: No mastoid effusions.

MRA HEAD FINDINGS

Anterior circulation: Bilateral intracranial ICAs, MCAs, and ACAs
are patent without proximal hemodynamically significant stenosis

Posterior circulation: Bilateral intradural vertebral arteries and
basilar artery are patent without significant stenosis. Occlusion of
the right P1 PCA. Left fetal type PCA. Left PCA is patent without
proximal hemodynamically significant stenosis.

MRA NECK FINDINGS

Right carotid system: Nondiagnostic evaluation of the right common
carotid artery origin and proximal common carotid artery due to MRA
technique/artifact. No visible significant (greater than 50%)
stenosis. Approximately 2 mm inferiorly directed outpouching arising
from the supraclinoid right ICA, favor infundibulum over aneurysm
given small vessel which probably originates from the tip.

Left carotid system: Nondiagnostic evaluation of the left common
carotid artery origin and proximal common carotid artery due to MRA
technique/artifact. No visible significant (greater than 50%)
stenosis.

Vertebral arteries: Nondiagnostic evaluation of the proximal
vertebral arteries. Also, nondiagnostic evaluation of the vertebral
arteries at the C1-C2 level due to in plane flow. The visible
vertebral arteries are patent without significant (greater than 50%
stenosis.
IMPRESSION: MRA head:

1. Right P1 PCA occlusion.
2. Approximately 2 mm inferiorly directed outpouching arising from
the supraclinoid right ICA, favor infundibulum over aneurysm given
small vessel near the tip.

MRI:

Multiple small acute infarcts within the right PCA territory,
including the right thalamus and occipital lobe. Slight edema
without mass effect.

MRA neck:

No visible significant (greater than 50%) stenosis with
nondiagnostic evaluation proximally, detailed above.

Findings discussed with Dr. HERLIN via telephone at [DATE].

## 2021-07-01 MED ORDER — CLOPIDOGREL BISULFATE 75 MG PO TABS
75.0000 mg | ORAL_TABLET | Freq: Once | ORAL | Status: AC
  Administered 2021-07-01: 75 mg via ORAL
  Filled 2021-07-01: qty 1

## 2021-07-01 MED ORDER — ASPIRIN EC 81 MG PO TBEC
81.0000 mg | DELAYED_RELEASE_TABLET | Freq: Every day | ORAL | Status: DC
  Administered 2021-07-02: 81 mg via ORAL
  Filled 2021-07-01: qty 1

## 2021-07-01 MED ORDER — ASPIRIN 325 MG PO TABS
325.0000 mg | ORAL_TABLET | Freq: Once | ORAL | Status: AC
  Administered 2021-07-01: 325 mg via ORAL
  Filled 2021-07-01: qty 1

## 2021-07-01 MED ORDER — SODIUM CHLORIDE 0.9% FLUSH
3.0000 mL | Freq: Once | INTRAVENOUS | Status: AC
  Administered 2021-07-01: 3 mL via INTRAVENOUS

## 2021-07-01 MED ORDER — ASPIRIN 325 MG PO TABS: 325.0000 mg | ORAL_TABLET | Freq: Every day | ORAL | Status: DC

## 2021-07-01 MED ORDER — CLOPIDOGREL BISULFATE 75 MG PO TABS
75.0000 mg | ORAL_TABLET | Freq: Every day | ORAL | Status: DC
  Administered 2021-07-02 – 2021-07-03 (×2): 75 mg via ORAL
  Filled 2021-07-01 (×2): qty 1

## 2021-07-01 MED ORDER — IOHEXOL 350 MG/ML SOLN
100.0000 mL | Freq: Once | INTRAVENOUS | Status: AC | PRN
  Administered 2021-07-01: 100 mL via INTRAVENOUS

## 2021-07-01 NOTE — Consult Note (Signed)
Neurology Consultation  Reason for Consult: Code Stroke Referring Physician: Dr. Pearline Cables  CC: Left-sided weakness, left mouth droop, slurred speech  History is obtained from: Patient, EMS  HPI: Christian Huynh is a 61 y.o. male with a past medical history significant for essential hypertension who presented to the ED via EMS for evaluation of left-sided weakness, slurred speech, and left mouth droop. Patient states that he went to bed last night around 22:00 in his usual state of health. When he woke this morning he states that he noticed that he felt weak on the left. At around 10:00 this morning patient felt the left side of his body and face became numb and he states that he had been dragging his left foot while walking throughout the day. He states that at 15:30, he fell due to the weakness and decided to come to the hospital for further evaluation.   Of note, the patient recently just traveled back from a vacation in [place] and landed in [place] approximately 5 days ago before driving to [place] to see his family and celebrate Christmas.   LKW: 06/30/2020 22:00 TNK given?: no, patient presented outside of the thrombolytic therapy window IR Thrombectomy? No, patient's presentation is not consistent with an LVO. Modified Rankin Scale: 0-Completely asymptomatic and back to baseline post- stroke  ROS: A complete ROS was performed and is negative except as noted in the HPI.   No past medical history on file. Past Medical History:  Hypertension  No family history on file. Father: Cancer, hypertension Mother: Strokes  Social History:   has no history on file for tobacco use, alcohol use, and drug use.  Medications  Current Facility-Administered Medications:    aspirin tablet 325 mg, 325 mg, Oral, Daily, Sponseller, Rebekah R, PA-C   clopidogrel (PLAVIX) tablet 75 mg, 75 mg, Oral, Once, Sponseller, Rebekah R, PA-C   sodium chloride flush (NS) 0.9 % injection 3 mL, 3 mL,  Intravenous, Once, Campbell Stall P, DO No current outpatient medications on file.  Exam: Current vital signs: Ht 5\' 9"  (1.753 m)    Wt 88.9 kg    BMI 28.94 kg/m  Vital signs in last 24 hours: Weight:  [88.9 kg] 88.9 kg (01/05 1654)  GENERAL: Awake, alert, in no acute distress Psych: Affect appropriate for situation, patient is calm and cooperative with examination Head: Normocephalic and atraumatic, without obvious abnormality EENT: Normal conjunctivae, dry mucous membranes, no OP obstruction LUNGS: Normal respiratory effort. Non-labored breathing on room air CV: Regular rate and rhythm on telemetry ABDOMEN: Soft, non-tender, non-distended Extremities: warm, well perfused, without obvious deformity  NEURO:  Mental Status: Awake, alert, and oriented to person, place, time, and situation. He is able to provide a clear and coherent history of present illness. Speech/Language: speech is mildly dysarthric  Naming, repetition, fluency, and comprehension intact without aphasia  No neglect is noted Cranial Nerves:  II: PERRL 3 mm/brisk. Visual fields full.  III, IV, VI: EOMI without ptosis, nystagmus, or gaze preference.  V: Patient reports decreased sensation to the left face VII: Left mouth droop present  VIII: Hearing is intact to voice IX, X: Palate elevation is symmetric. Phonation normal.  XI: Normal sternocleidomastoid and trapezius muscle strength XII: Tongue protrudes midline without fasciculations.   Motor: Patient has 5/5 strength of right upper and lower extremities and minimal weakness of the left upper and lower extremities with minimal vertical drift on assessment.   Tone is normal. Bulk is normal.  Sensation: Decreased sensation to light  touch reported on the left upper and lower extremities compared to the right.   Coordination: FTN intact bilaterally. HKS intact bilaterally. Gait: Deferred for patient safety with a reported fall prior to arrival   NIHSS: 1a Level of  Conscious.: 0 1b LOC Questions: 0 1c LOC Commands: 0 2 Best Gaze: 0 3 Visual: 0 4 Facial Palsy: 1 5a Motor Arm - left: 1 5b Motor Arm - Right: 0 6a Motor Leg - Left: 1 6b Motor Leg - Right: 0 7 Limb Ataxia: 0 8 Sensory: 1 9 Best Language: 0 10 Dysarthria: 1 11 Extinct. and Inatten.: 0 TOTAL: 5  Labs I have reviewed labs in epic and the results pertinent to this consultation are: CBC    Component Value Date/Time   WBC 6.6 07/01/2021 1632   RBC 5.87 (H) 07/01/2021 1632   HGB 18.0 (H) 07/01/2021 1638   HCT 53.0 (H) 07/01/2021 1638   PLT 174 07/01/2021 1632   MCV 92.0 07/01/2021 1632   MCH 31.3 07/01/2021 1632   MCHC 34.1 07/01/2021 1632   RDW 12.9 07/01/2021 1632   LYMPHSABS 2.1 07/01/2021 1632   MONOABS 0.5 07/01/2021 1632   EOSABS 0.4 07/01/2021 1632   BASOSABS 0.1 07/01/2021 1632   CMP     Component Value Date/Time   NA 140 07/01/2021 1638   K 4.1 07/01/2021 1638   CL 103 07/01/2021 1638   GLUCOSE 91 07/01/2021 1638   BUN 18 07/01/2021 1638   CREATININE 1.10 07/01/2021 1638   Lipid Panel  No results found for: CHOL, TRIG, HDL, CHOLHDL, VLDL, LDLCALC, LDLDIRECT No results found for: HGBA1C  Imaging I have reviewed the images obtained:  CT-scan of the brain 1/5: 1. No acute intracranial process. 2. ASPECTS is 10  Assessment: 61 y.o. male who presented to the ED via EMS for evaluation of left hemiplegia, dysarthria, and left mouth droop with symptom onset when he woke up this morning. - Examination reveals patient with a left mouth droop, sensory deficit to the left face, LUE, and LLE. There is minimal weakness noted of the LUE and LLE with an initial NIHSS of 5.  - Patient is not a candidate for TNK due to presenting outside of the thrombolytic therapy time window. Not a candidate for IR as his presentation is not consistent with an LVO. - Patient's only reported stroke risk factor is essential hypertension. - CT was obtained without evidence of acute  intracranial process with an ASPECTS of 10.  - Patient's presentation is most consistent with an ischemic stroke. Will obtain stroke work up.   Recommendations: - HgbA1c, fasting lipid panel - MRI brain, MRA head and neck without contrast - Frequent neuro checks - Echocardiogram - Prophylactic therapy-Antiplatelet med: DAPT with Aspirin - dose 325mg  PO or 300mg  PR followed by ASA 81 mg PO daily and clopidogrel 75 mg PO daily for 21 days, then ASA monotherapy.  - Risk factor modification - Telemetry monitoring - PT consult, OT consult, Speech consult - Stroke team to follow  Pt seen by NP/Neuro and later by MD. Note/plan to be edited by MD as needed.  Anibal Henderson, AGAC-NP [facility] Pager: [phone]  Neurology Attending Attestation   I examined the patient and discussed plan with Ms. Toberman NP. Above note has been edited by me to reflect my findings and recommendations. I was present throughout the stroke code and made all significant decisions and personally reviewed CNS imaging. Patient presented outside the window for TNK with findings c/f  acute ischemic stroke. Admit to hospitalist service for stroke workup.   Su Monks, MD [facility] [phone]   If 7pm- 7am, please page neurology on call as listed in [place].

## 2021-07-01 NOTE — Assessment & Plan Note (Addendum)
CT chest showing ground glass Sed rate no cough no WBC no fever to suggest infection  Held off abx for now Would benefir from pulmonary work up in AM

## 2021-07-01 NOTE — ED Triage Notes (Signed)
Pt bib [facility] from home as a code stroke. Per EMS, pt's LSN was yesterday, had been having stroke like symptoms throughout the day; However, symptoms worsened around 1520 today. Pt fell w his L leg dragging. Pt also exhibited  L side weakness, slurred speech and L facial droop. HX HTN, complaint w meds.  Vitals were stable w EMS.

## 2021-07-01 NOTE — ED Notes (Signed)
Patient transported to CT 

## 2021-07-01 NOTE — Assessment & Plan Note (Signed)
Rehydrate gently if persists in the setting of CVA will need further evaluation and possibly treatmeny

## 2021-07-01 NOTE — H&P (Signed)
Christian Huynh WJX:[id number] DOB: [birth year] DOA: 07/01/2021    PCP: Oneita HurtPcp, No   Outpatient Specialists:  NONE    Patient arrived to ER on 07/01/21 at 1630 Referred by Attending Therisa Doyneoutova, Jacquelina Hewins, MD   Patient coming from: home Lives alone,       Chief Complaint:   Chief Complaint  Patient presents with   Code Stroke    HPI: Christian MansGregory Huynh is a 61 y.o. male with medical history significant of hypertension    Presented with   left-sided weakness Left sided weakness left facial drool slurred speech  Patient went to bed around 10 PM and when he woke up around 10 AM he had 2 symptoms.  He started to drag his foot sometimes throughout the day and noticed when he was talking to people he had slurred speech.  By 3:30 PM he fell down secondary to weakness and not following presented to emergency department code stroke was activated  Recent travel last week to [place]  Never smoker, occasional EtOH No cough no SOB no CP  No DOE, no weight loss Denies TB exposure Travels a lot Been to [place] past 2 years No leg swelling  Has  been vaccinated against COVID  and boosted had  flu shot   Initial COVID TEST  NEGATIVE   Lab Results  Component Value Date   SARSCOV2NAA NEGATIVE 07/01/2021     Regarding pertinent Chronic problems:       HTN on losartan       While in ER: Clinical Course as of 07/01/21 1919  Thu Jul 01, 2021  1709 Consult to neuro hospitalist APP S. Cindie Larocheoberman, NP who escorted the patient back from the CT scanner.  Last known well turns out was yesterday evening.  Patient woke up this morning with left-sided weakness and facial droop.  Initial CT was negative but patient will require admission to the hospital for completion of stroke work-up.  Not a TNKase candidate given last known well nearly 24 hours ago.  We will proceed with Plavix and aspirin administration per neuro recommendation. [RS]  1828 Consult to hospitalist, Dr. Adela Glimpseoutova,  who is agreeable to seeing this patient and admitting him to her service.  I appreciate her collaboration in the care of this patient [RS]    Clinical Course User Index [RS] Sponseller, Rebekah R, PA-C    Noted to have elevated hemoglobin up to 18   Ordered  CT HEAD   NON acute  CXR - Bibasilar reticulonodular infiltrates, likely infectious or inflammatory. Scattered ground-glass opacities in the lungs with a peripheral predominance, possible infectious or inflammatory pneumonitis. 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 Slight edema without mass effect. No visible significant (greater than 50%) stenosis with nondiagnostic evaluation proximally, detailed above. Following Medications were ordered in ER: Medications  clopidogrel (PLAVIX) tablet 75 mg (has no administration in time range)  aspirin EC tablet 81 mg (has no administration in time range)  aspirin tablet 325 mg (has no administration in time range)  clopidogrel (PLAVIX) tablet 75 mg (has no administration in time range)  sodium chloride flush (NS) 0.9 % injection 3 mL (3 mLs Intravenous Given 07/01/21 1709)    _______________________________________________________ ER Provider Called:   Neurology   Dr. Selina CooleyStack They Recommend admit to medicine    SEEN in ER   ED Triage  Vitals  Enc Vitals Group     BP 07/01/21 1652 (!) 155/91     Pulse Rate 07/01/21 1658 64     Resp 07/01/21 1652 (!) 21     Temp 07/01/21 1659 (!) 97.2 F (36.2 C)     Temp Source 07/01/21 1659 Oral     SpO2 07/01/21 1658 96 %     Weight 07/01/21 1654 196 lb (88.9 kg)     Height 07/01/21 1654 5\' 9"  (1.753 m)     Head Circumference --      Peak Flow --      Pain Score 07/01/21 1654 0     Pain Loc --      Pain Edu? --      Excl. in [place]? --   TMAX(24)@     _________________________________________ Significant initial   Findings: Abnormal Labs Reviewed  CBC - Abnormal; Notable for the following components:      Result Value   RBC 5.87 (*)    Hemoglobin 18.4 (*)    HCT 54.0 (*)    All other components within normal limits  COMPREHENSIVE METABOLIC PANEL - Abnormal; Notable for the following components:   Total Bilirubin 1.4 (*)    All other components within normal limits  HEMOGLOBIN A1C - Abnormal; Notable for the following components:   Hgb A1c MFr Bld 4.5 (*)    All other components within normal limits  I-STAT CHEM 8, ED - Abnormal; Notable for the following components:   Calcium, Ion 1.13 (*)    Hemoglobin 18.0 (*)    HCT 53.0 (*)    All other components within normal limits      ECG: Ordered Personally reviewed by me showing: HR : 65 Rhythm:  NSR,    no evidence of ischemic changes QTC 444    The recent clinical data is shown below. Vitals:   07/01/21 1652 07/01/21 1654 07/01/21 1658 07/01/21 1659  BP: (!) 155/91     Pulse:   64   Resp: (!) 21  19   Temp:    (!) 97.2 F (36.2 C)  TempSrc:    Oral  SpO2:   96%   Weight:  88.9 kg    Height:  5\' 9"  (1.753 m)       WBC     Component Value Date/Time   WBC 6.6 07/01/2021 1632   LYMPHSABS 2.1 07/01/2021 1632   MONOABS 0.5 07/01/2021 1632   EOSABS 0.4 07/01/2021 1632   BASOSABS 0.1 07/01/2021 1632      UA   no evidence of UTI      Urine analysis:    Component Value Date/Time   COLORURINE STRAW (A) 07/01/2021 2013   APPEARANCEUR CLEAR 07/01/2021 2013   LABSPEC 1.005 07/01/2021 2013   PHURINE 7.0 07/01/2021 2013   GLUCOSEU NEGATIVE 07/01/2021 2013   HGBUR NEGATIVE 07/01/2021 2013   [place] NEGATIVE 07/01/2021 2013   [place] NEGATIVE 07/01/2021 2013   PROTEINUR NEGATIVE 07/01/2021 2013   NITRITE NEGATIVE 07/01/2021 2013   LEUKOCYTESUR NEGATIVE 07/01/2021 2013    No results found for this or any previous visit.   _______________________________________________ Hospitalist was called for admission for  CVA  The following Work up has been ordered so far:  Orders Placed This Encounter  Procedures   Resp Panel by RT-PCR (Flu A&B, Covid) Nasopharyngeal Swab   CT HEAD CODE STROKE WO CONTRAST   MR BRAIN WO CONTRAST   MR ANGIO HEAD WO CONTRAST   MR ANGIO  NECK WO CONTRAST   Protime-INR   APTT   CBC   Differential   Comprehensive metabolic panel   Urinalysis, Routine w reflex microscopic   Urine rapid drug screen (hosp performed)   Ethanol   Hemoglobin A1c   Lipid panel   Diet NPO time specified   Cardiac monitoring   Stroke swallow screen   NIH Stroke Scale   Modified Stroke Scale (mNIHSS) Document mNIHSS assessment every 2 hours for a total of 12 hours   Saline Lock IV, Maintain IV access   If O2 sat   Cardiac monitoring   Consult for Unassigned Medical Admission   Pulse oximetry, continuous   I-stat chem 8, ED   CBG monitoring, ED   ED EKG   EKG 12-Lead   ECHOCARDIOGRAM COMPLETE   Admit to Inpatient (patient's expected length of stay will be greater than 2 midnights or inpatient only procedure)     OTHER Significant initial  Findings:  labs showing:    Recent Labs  Lab 07/01/21 1632 07/01/21 1638  NA 138 140  K 4.1 4.1  CO2 26  --   GLUCOSE 94 91  BUN 15 18  CREATININE 1.21 1.10  CALCIUM 9.3  --     Cr   stable,   Lab Results  Component Value Date   CREATININE 1.10 07/01/2021   CREATININE 1.21 07/01/2021    Recent Labs  Lab 07/01/21 1632  AST 40  ALT 43  ALKPHOS 70  BILITOT 1.4*  PROT 7.1  ALBUMIN 4.0   Lab Results  Component Value Date   CALCIUM 9.3 07/01/2021          Plt: Lab Results  Component Value Date   PLT 174 07/01/2021        Recent Labs  Lab 07/01/21 1632 07/01/21 1638  WBC 6.6  --   NEUTROABS 3.7  --   HGB 18.4* 18.0*  HCT 54.0* 53.0*  MCV 92.0  --   PLT 174  --     HG/HCT  elevated    Component Value Date/Time   HGB 18.0 (H) 07/01/2021 1638   HCT 53.0 (H) 07/01/2021 1638   MCV 92.0 07/01/2021 1632       DM  labs:  HbA1C: Recent Labs    07/01/21 1630  HGBA1C 4.5*       CBG (last 3)  Recent Labs    07/01/21 1633  GLUCAP 92          Cultures: No results found for: SDES, SPECREQUEST, CULT, REPTSTATUS   Radiological Exams on Admission: CT Angio Chest Pulmonary Embolism (PE) W or WO Contrast  Result Date: 07/01/2021 CLINICAL DATA:  Pulmonary embolism suspected, high probability. EXAM: CT ANGIOGRAPHY CHEST WITH CONTRAST TECHNIQUE: Multidetector CT imaging of the chest was performed using the standard protocol during bolus administration of intravenous contrast. Multiplanar CT image reconstructions and MIPs were obtained to evaluate the vascular anatomy. CONTRAST:  172mL OMNIPAQUE IOHEXOL 350 MG/ML SOLN COMPARISON:  None. FINDINGS: Cardiovascular: The heart is normal in size and there is no pericardial effusion. Mild aortic atherosclerosis without evidence of aneurysm. The pulmonary trunk is normal in caliber. No definite evidence of pulmonary embolism. Evaluation of the segmental arteries is limited due to mixing artifact and respiratory motion. Mediastinum/Nodes: Calcified nonenlarged lymph nodes are present in the mediastinum and right hilum. No axillary lymphadenopathy. The thyroid gland, trachea, and esophagus are within normal limits. Lungs/Pleura: Scattered ground-glass opacities are noted in the lungs with a peripheral predominance. No  effusion or pneumothorax. Upper Abdomen: The spleen is enlarged measuring 14.1 cm in length. Musculoskeletal: Mild degenerative changes in the thoracic spine. No acute osseous abnormality. Review of the MIP images confirms the above findings. IMPRESSION: 1. No definite evidence of pulmonary embolism. 2. Scattered ground-glass opacities in the lungs with a peripheral predominance, possible infectious or inflammatory pneumonitis. 3. Mild splenomegaly. Electronically Signed   By: Thornell Sartorius M.D.   On: 07/01/2021 22:03   MR ANGIO HEAD WO CONTRAST  Result  Date: 07/01/2021 CLINICAL DATA:  Neuro deficit, acute, stroke suspected EXAM: MRI HEAD WITHOUT CONTRAST MRA HEAD WITHOUT CONTRAST MRA NECK WITHOUT CONTRAST TECHNIQUE: Multiplanar, multiecho pulse sequences of the brain and surrounding structures were obtained without intravenous contrast. Angiographic images of the Circle of Willis were obtained using MRA technique without intravenous contrast. Angiographic images of the neck were obtained using MRA technique without intravenous contrast. Carotid stenosis measurements (when applicable) are obtained utilizing NASCET criteria, using the distal internal carotid diameter as the denominator. COMPARISON:  CT head from the same day. 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 No hydrocephalus, acute hemorrhage, midline shift, mass lesion, or visible extra-axial fluid collection mild atrophy. Vascular: Detailed below. Skull and upper cervical spine: Normal marrow signal. Sinuses/Orbits: Mild paranasal sinus mucosal thickening. Unremarkable orbits. Other: No mastoid effusions. MRA HEAD FINDINGS Anterior circulation: Bilateral intracranial ICAs, MCAs, and ACAs are patent without proximal hemodynamically significant stenosis Posterior circulation: Bilateral intradural vertebral arteries and basilar artery are patent without significant stenosis. Occlusion of the right P1 PCA. Left fetal type PCA. Left PCA is patent without proximal hemodynamically significant stenosis. MRA NECK FINDINGS Right carotid system: Nondiagnostic evaluation of the right common carotid artery origin and proximal common carotid artery due to MRA technique/artifact. No visible significant (greater than 50%) stenosis. Approximately 2 mm inferiorly directed outpouching arising from the supraclinoid right ICA, favor infundibulum over aneurysm given small vessel which probably originates from the tip. Left  carotid system: Nondiagnostic evaluation of the left common carotid artery origin and proximal common carotid artery due to MRA technique/artifact. No visible significant (greater than 50%) stenosis. Vertebral arteries: Nondiagnostic evaluation of the proximal vertebral arteries. Also, nondiagnostic evaluation of the vertebral arteries at the C1-C2 level due to in plane flow. The visible vertebral arteries are patent without significant (greater than 50% stenosis. IMPRESSION: MRA head: 1. Right P1 PCA occlusion. 2. Approximately 2 mm inferiorly directed outpouching arising from the supraclinoid right ICA, favor infundibulum over aneurysm given small vessel near the tip. 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 Slight edema without mass effect. MRA neck: No visible significant (greater than 50%) stenosis with nondiagnostic evaluation proximally, detailed above. Findings discussed with Dr. Darlyn Read via telephone at 6:25 PM. Electronically Signed   By: Feliberto Harts M.D.   On: 07/01/2021 18:26   MR ANGIO NECK WO CONTRAST  Result Date: 07/01/2021 CLINICAL DATA:  Neuro deficit, acute, stroke suspected EXAM: MRI HEAD WITHOUT CONTRAST MRA HEAD WITHOUT CONTRAST MRA NECK WITHOUT CONTRAST TECHNIQUE: Multiplanar, multiecho pulse sequences of the brain and surrounding structures were obtained without intravenous contrast. Angiographic images of the Circle of Willis were obtained using MRA technique without intravenous contrast. Angiographic images of the neck were obtained using MRA technique without intravenous contrast. Carotid stenosis measurements (when applicable) are obtained utilizing NASCET criteria, using the distal internal carotid diameter as the denominator. COMPARISON:  CT head from the same day. 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 No hydrocephalus, acute hemorrhage, midline shift, mass lesion, or visible extra-axial fluid collection mild atrophy. Vascular: Detailed below. Skull and upper cervical spine: Normal marrow signal. Sinuses/Orbits: Mild paranasal sinus mucosal thickening. Unremarkable orbits. Other: No mastoid effusions. MRA HEAD FINDINGS Anterior circulation: Bilateral intracranial ICAs, MCAs, and ACAs are patent without proximal hemodynamically significant stenosis Posterior circulation: Bilateral intradural vertebral arteries and basilar artery are patent without significant stenosis. Occlusion of the right P1 PCA. Left fetal type PCA. Left PCA is patent without proximal hemodynamically significant stenosis. MRA NECK FINDINGS Right carotid system: Nondiagnostic evaluation of the right common carotid artery origin and proximal common carotid artery due to MRA technique/artifact. No visible significant (greater than 50%) stenosis. Approximately 2 mm inferiorly directed outpouching arising from the supraclinoid right ICA, favor infundibulum over aneurysm given small vessel which probably originates from the tip. Left carotid system: Nondiagnostic evaluation of the left common carotid artery origin and proximal common carotid artery due to MRA technique/artifact. No visible significant (greater than 50%) stenosis. Vertebral arteries: Nondiagnostic evaluation of the proximal vertebral arteries. Also, nondiagnostic evaluation of the vertebral arteries at the C1-C2 level due to in plane flow. The visible vertebral arteries are patent without significant (greater than 50% stenosis. IMPRESSION: MRA head: 1. Right P1 PCA occlusion. 2. Approximately 2 mm inferiorly directed outpouching arising from the supraclinoid right ICA, favor infundibulum over aneurysm given small vessel near the tip. 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 Slight edema without mass  effect. MRA neck: No visible significant (greater than 50%) stenosis with nondiagnostic evaluation proximally, detailed above. Findings discussed with Dr. Livia Snellen via telephone at 6:25 PM. Electronically Signed   By: Margaretha Sheffield M.D.   On: 07/01/2021 18:26   MR BRAIN WO CONTRAST  Result Date: 07/01/2021 CLINICAL DATA:  Neuro deficit, acute, stroke suspected EXAM: MRI HEAD WITHOUT CONTRAST MRA HEAD WITHOUT CONTRAST MRA NECK WITHOUT CONTRAST TECHNIQUE: Multiplanar, multiecho pulse sequences of the brain and surrounding structures were obtained without intravenous contrast. Angiographic images of the Circle of Willis were obtained using MRA technique without intravenous contrast. Angiographic images of the neck were obtained using MRA technique without intravenous contrast. Carotid stenosis measurements (when applicable) are obtained utilizing NASCET criteria, using the distal internal carotid diameter as the denominator. COMPARISON:  CT head from the same day. 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 No hydrocephalus, acute hemorrhage, midline shift, mass lesion, or visible extra-axial fluid collection mild atrophy. Vascular: Detailed below. Skull and upper cervical spine: Normal marrow signal. Sinuses/Orbits: Mild paranasal sinus mucosal thickening. Unremarkable orbits. Other: No mastoid effusions. MRA HEAD FINDINGS Anterior circulation: Bilateral intracranial ICAs, MCAs, and ACAs are patent without proximal hemodynamically significant stenosis Posterior circulation: Bilateral intradural vertebral arteries and basilar artery are patent without significant stenosis. Occlusion of the right P1 PCA. Left fetal type PCA. Left PCA is patent without proximal hemodynamically significant stenosis. MRA NECK FINDINGS Right carotid system: Nondiagnostic evaluation of the right common carotid artery origin and proximal  common carotid artery due to MRA technique/artifact. No visible significant (greater than 50%) stenosis. Approximately 2 mm inferiorly directed outpouching arising from the supraclinoid right ICA, favor infundibulum over aneurysm given small vessel which probably originates from the tip.  Left carotid system: Nondiagnostic evaluation of the left common carotid artery origin and proximal common carotid artery due to MRA technique/artifact. No visible significant (greater than 50%) stenosis. Vertebral arteries: Nondiagnostic evaluation of the proximal vertebral arteries. Also, nondiagnostic evaluation of the vertebral arteries at the C1-C2 level due to in plane flow. The visible vertebral arteries are patent without significant (greater than 50% stenosis. IMPRESSION: MRA head: 1. Right P1 PCA occlusion. 2. Approximately 2 mm inferiorly directed outpouching arising from the supraclinoid right ICA, favor infundibulum over aneurysm given small vessel near the tip. 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 Slight edema without mass effect. MRA neck: No visible significant (greater than 50%) stenosis with nondiagnostic evaluation proximally, detailed above. Findings discussed with Dr. Livia Snellen via telephone at 6:25 PM. Electronically Signed   By: Margaretha Sheffield M.D.   On: 07/01/2021 18:26   DG CHEST PORT 1 VIEW  Result Date: 07/01/2021 CLINICAL DATA:  Dyspnea EXAM: PORTABLE CHEST 1 VIEW COMPARISON:  None. FINDINGS: The lungs are symmetrically expanded. Asymmetric bibasilar reticulonodular infiltrates are present, more severe at the left lung base, likely infectious or inflammatory in the acute setting. No pneumothorax or pleural effusion. Cardiac size within normal limits. No acute bone abnormality. IMPRESSION: Bibasilar reticulonodular infiltrates, likely infectious or inflammatory. Follow-up examination in 3-4 weeks may be helpful in documenting  resolution. If persistent, these may be better assessed with high-resolution CT examination at that point. Electronically Signed   By: Fidela Salisbury M.D.   On: 07/01/2021 20:22   CT HEAD CODE STROKE WO CONTRAST  Result Date: 07/01/2021 CLINICAL DATA:  Code stroke. EXAM: CT HEAD WITHOUT CONTRAST TECHNIQUE: Contiguous axial images were obtained from the base of the skull through the vertex without intravenous contrast. COMPARISON:  None. FINDINGS: Brain: No evidence of acute infarction, hemorrhage, cerebral edema, mass, mass effect, or midline shift. Ventricles and sulci are normal for age. No extra-axial fluid collection. Vascular: No hyperdense vessel or unexpected calcification. Skull: Normal. Negative for fracture or focal lesion. Mucosal thickening in the ethmoid air cells. Hypoplastic right frontal sinus. The orbits are negative.: No acute finding. Other: The mastoid air cells are well aerated. ASPECTS [facility] Stroke Program Early CT Score) - Ganglionic level infarction (caudate, lentiform nuclei, internal capsule, insula, M1-M3 cortex): 7 - Supraganglionic infarction (M4-M6 cortex): 3 Total score (0-10 with 10 being normal): 10 IMPRESSION: 1. No acute intracranial process. 2. ASPECTS is 10 Code stroke imaging results were communicated on 07/01/2021 at 4:49 pm to provider Su Monks via secure text paging. Electronically Signed   By: Merilyn Baba M.D.   On: 07/01/2021 16:49   _______________________________________________________________________________________________________ Latest   Blood pressure (!) 155/91, pulse 64, temperature (!) 97.2 F (36.2 C), temperature source Oral, resp. rate 19, height 5\' 9"  (1.753 m), weight 88.9 kg, SpO2 96 %.   Vitals  labs and radiology finding personally reviewed  Review of Systems:    Pertinent positives include:   localizing neurological complaints,  Constitutional:  No weight loss, night sweats, Fevers, chills, fatigue, weight loss  HEENT:  No  headaches, Difficulty swallowing,Tooth/dental problems,Sore throat,  No sneezing, itching, ear ache, nasal congestion, post nasal drip,  Cardio-vascular:  No chest pain, Orthopnea, PND, anasarca, dizziness, palpitations.no Bilateral lower extremity swelling  GI:  No heartburn, indigestion, abdominal pain, nausea, vomiting, diarrhea, change in bowel habits, loss of appetite, melena, blood in stool, hematemesis Resp:  no shortness of breath at rest. No dyspnea on  exertion, No excess mucus, no productive cough, No non-productive cough, No coughing up of blood.No change in color of mucus.No wheezing. Skin:  no rash or lesions. No jaundice GU:  no dysuria, change in color of urine, no urgency or frequency. No straining to urinate.  No flank pain.  Musculoskeletal:  No joint pain or no joint swelling. No decreased range of motion. No back pain.  Psych:  No change in mood or affect. No depression or anxiety. No memory loss.  Neuro: no no tingling, no weakness, no double vision, no gait abnormality, no slurred speech, no confusion  All systems reviewed and apart from [place] all are negative _______________________________________________________________________________________________ Past Medical History:  No past medical history on file.    History reviewed. No pertinent surgical history.  Social History:  Ambulatory   independently       has no history on file for tobacco use, alcohol use, and drug use.     Family History:   Family History  Problem Relation Age of Onset   Stroke Mother    Pancreatic cancer Father    ______________________________________________________________________________________________ Allergies: No Known Allergies   Prior to Admission medications   Not on File    ___________________________________________________________________________________________________ Physical Exam: Vitals with BMI 07/01/2021  Height 5\' 9"   Weight 196 lbs  BMI [id number]   Systolic [id number]  Diastolic 91  Pulse 64     1. General:  in No  Acute distress   Chronically ill   -appearing 2. Psychological: Alert and  Oriented 3. Head/ENT:   Dry Mucous Membranes                          Head Non traumatic, neck supple                         Poor Dentition 4. SKIN:  decreased Skin turgor,  Skin clean Dry and intact no rash 5. Heart: Regular rate and rhythm no  Murmur, no Rub or gallop 6. Lungs: distant , no wheezes or crackles   7. Abdomen: Soft,  non-tender, Non distended   bowel sounds present 8. Lower extremities: no clubbing, cyanosis, no  edema 9. Neurologically   strength 3out of 5 on LEft  extremities cranial nerves II through XII intact 10. MSK: Normal range of motion    Chart has been reviewed  ______________________________________________________________________________________________  Assessment/Plan 61 y.o. male with medical history significant of hypertension    Admitted for CVA  Present on Admission:  CVA (cerebral vascular accident) ([place])  Essential hypertension  Polycythemia  Abnormal CXR     CVA (cerebral vascular accident) ([place])  - will admit based on TIA/CVA protocol,        Monitor on Tele       MRA/MR  Resulted - showing acute ischemic CVAs   MRI/MRA - Right P1 PCA occlusion. Multiple small acute infarcts within the right PCA territory, including the right thalamus and occipital lobe. Slight edema without mass effect. No visible significant (greater than 50%) stenosis with nondiagnostic evaluation proximally, detailed above.         Echo to evaluate for possible embolic source,        obtain cardiac enzymes,  ECG,   Lipid panel, TSH.        Order PT/OT evaluation.       passed swallow eval        Will make sure patient is on antiplatelet ASA 81mg  +  Plavix 75 mg for 21 d     statin if indicated       Allow permissive Hypertension keep BP <220/120        Neurology consulted Have seen pt in ER    Essential  hypertension Allow permissive HTN  Polycythemia Rehydrate gently if persists in the setting of CVA will need further evaluation and possibly treatmeny  Abnormal CXR   CT chest showing ground glass Sed rate no cough no WBC no fever to suggest infection  Held off abx for now Would benefir from pulmonary work up in AM   Other plan as per orders.  DVT prophylaxis:  SCD       Code Status:    Code Status: Not on file FULL CODE  as per patient    I had personally discussed CODE STATUS with patient    Family Communication:   Family not at  Bedside    Disposition Plan:   To home once workup is complete and patient is stable   Following barriers for discharge:                            Electrolytes corrected                               Anemia corrected                             Pain controlled with PO medications                               Afebrile, white count improving able to transition to PO antibiotics                             Will need to be able to tolerate PO                            Will likely need home health, home O2, set up                           Will need consultants to evaluate patient prior to discharge                       Would benefit from PT/OT eval prior to DC  Ordered                    Consults called:  neurology is aware  Admission status:  ED Disposition     ED Disposition  [place]: [place] [[id number]]  Level of Care: Telemetry Medical [[id number]]  May admit patient to [facility] or [facility] if equivalent level of care is available:: No  Covid Evaluation: Asymptomatic Screening Protocol (No Symptoms)  [facility]: [id number]  Admitting Physician: Toy Baker [[id number]]  Attending Physician: Toy Baker [[id number]]  Estimated length of stay: past midnight tomorrow  Certification:: I certify this patient will need inpatient services for at  least 2 midnights             inpatient  I Expect 2 midnight stay secondary to severity of patient's current illness need for inpatient interventions justified by the following:    Severe lab/radiological/exam abnormalities including:    Neurological deficits    That are currently affecting medical management.   I expect  patient to be hospitalized for 2 midnights requiring inpatient medical care.  Patient is at high risk for adverse outcome (such as loss of life or disability) if not treated.  Indication for inpatient stay as follows:   CVA   Need for  IV fluids,      Level of care     tele    indefinitely please discontinue once patient no longer qualifies COVID-19 Labs    Lab Results  Component Value Date   [place] 07/01/2021     Precautions: admitted as   Covid Negative        Crucita Lacorte 07/02/2021, 2:20 AM    [facility]     after 2 AM please page floor coverage PA If 7AM-7PM, please contact the day team taking care of the patient using [email or web address]   Patient was evaluated in the context of the global COVID-19 pandemic, which necessitated consideration that the patient might be at risk for infection with the SARS-CoV-2 virus that causes COVID-19. Institutional protocols and algorithms that pertain to the evaluation of patients at risk for COVID-19 are in a state of rapid change based on information released by regulatory bodies including the CDC and federal and state organizations. These policies and algorithms were followed during the patient's care.

## 2021-07-01 NOTE — Assessment & Plan Note (Signed)
Allow permissive HTN 

## 2021-07-01 NOTE — ED Notes (Signed)
Patient transported to MRI 

## 2021-07-01 NOTE — Progress Notes (Signed)
I was called by Lissa Merlin neuroradiologist who reported patient had R P1 PCA occlusion on MRA and multiple small infarcts within that territory. I re-examined patient and exam has improved to NIHSS = 3 for minimal dysarthria, sensory deficit, and L facial droop. His exam does not warrant intervention. If his neuro exam declines overnight, stroke code should be re-activated and include STAT CTA/CTP.  Su Monks, MD [facility] [phone]  If 7pm- 7am, please page neurology on call as listed in [place].

## 2021-07-01 NOTE — Subjective & Objective (Signed)
Left sided weakness left facial drool slurred speech  Patient went to bed around 10 PM and when he woke up around 10 AM he had 2 symptoms.  He started to drag his foot sometimes throughout the day and noticed when he was talking to people he had slurred speech.  By 3:30 PM he fell down secondary to weakness and not following presented to emergency department code stroke was activated

## 2021-07-01 NOTE — ED Provider Notes (Signed)
[facility] EMERGENCY DEPARTMENT Provider Note   CSN: [id number] Arrival date & time: 07/01/21  1630  An emergency department physician performed an initial assessment on this suspected stroke patient at 1631.  History  Chief Complaint  Patient presents with   Code Stroke    Greggory Maben is a 61 y.o. male initially presented to the ED as a code stroke, however after neurologic evaluation it became clear that last known normal was yesterday evening before he went to bed.  States that he woke up today with tingling in the left side of his face, left upper extremity weakness, and dragging the left leg.  According to his wife at the bedside he was having some slurred speech and some increase in left-sided facial droop.  I personally reviewed this patient's medical records.  Has history of hypertension but endorses compliance with his medications.  HPI     Home Medications Prior to Admission medications   Medication Sig Start Date End Date Taking? Authorizing Provider  hydrochlorothiazide (HYDRODIURIL) 12.5 MG tablet Take 12.5 mg by mouth daily.   Yes [provider]  ibuprofen (ADVIL) 200 MG tablet Take 800 mg by mouth every 6 (six) hours as needed for moderate pain.   Yes [provider]  losartan (COZAAR) 50 MG tablet Take 50 mg by mouth daily. 04/29/21  Yes [provider]      Allergies    Patient has no known allergies.    Review of Systems   Review of Systems  Constitutional: Negative.   HENT: Negative.    Eyes: Negative.   Respiratory: Negative.    Cardiovascular: Negative.   Gastrointestinal: Negative.   Genitourinary: Negative.   Musculoskeletal: Negative.   Neurological:  Positive for facial asymmetry, weakness and numbness. Negative for dizziness, light-headedness and headaches.  Psychiatric/Behavioral: Negative.     Physical Exam Updated Vital Signs BP 124/73    Pulse 66    Temp (!) 97.2 F (36.2 C) (Oral)     Resp 19    Ht 5\' 9"  (1.753 m)    Wt 88.9 kg    SpO2 92%    BMI 28.94 kg/m  Physical Exam Vitals and nursing note reviewed.  Constitutional:      Appearance: He is not ill-appearing or toxic-appearing.  HENT:     Head: Normocephalic and atraumatic.     Nose: Nose normal.     Mouth/Throat:     Mouth: Mucous membranes are moist.     Pharynx: No oropharyngeal exudate or posterior oropharyngeal erythema.  Eyes:     General:        Right eye: No discharge.        Left eye: No discharge.     Extraocular Movements: Extraocular movements intact.     Conjunctiva/sclera: Conjunctivae normal.     Pupils: Pupils are equal, round, and reactive to light.  Cardiovascular:     Rate and Rhythm: Normal rate and regular rhythm.     Pulses: Normal pulses.     Heart sounds: Normal heart sounds. No murmur heard. Pulmonary:     Effort: Pulmonary effort is normal. No respiratory distress.     Breath sounds: Normal breath sounds. No wheezing or rales.  Abdominal:     General: Bowel sounds are normal. There is no distension.     Palpations: Abdomen is soft.     Tenderness: There is no abdominal tenderness. There is no guarding or rebound.  Musculoskeletal:  General: No deformity.     Cervical back: Neck supple. No tenderness.     Right lower leg: No edema.     Left lower leg: No edema.  Lymphadenopathy:     Cervical: No cervical adenopathy.  Skin:    General: Skin is warm and dry.     Capillary Refill: Capillary refill takes less than 2 seconds.  Neurological:     Mental Status: He is alert and oriented to person, place, and time.     GCS: GCS eye subscore is 4. GCS verbal subscore is 5. GCS motor subscore is 6.     Cranial Nerves: Facial asymmetry present.     Sensory: Sensation is intact.     Motor: Weakness present. No tremor or pronator drift.     Comments: Left upper extremity and lower extremity weakness relative to the right, subtle blood present.  Paresthesias left half of the face  with left-sided facial droop.  According to the patient's wife he does have some baseline left-sided facial asymmetry but it is increased from his baseline at this time.  Psychiatric:        Mood and Affect: Mood normal.    ED Results / Procedures / Treatments   Labs (all labs ordered are listed, but only abnormal results are displayed) Labs Reviewed  CBC - Abnormal; Notable for the following components:      Result Value   RBC 5.87 (*)    Hemoglobin 18.4 (*)    HCT 54.0 (*)    All other components within normal limits  COMPREHENSIVE METABOLIC PANEL - Abnormal; Notable for the following components:   Total Bilirubin 1.4 (*)    All other components within normal limits  URINALYSIS, ROUTINE W REFLEX MICROSCOPIC - Abnormal; Notable for the following components:   Color, Urine STRAW (*)    All other components within normal limits  HEMOGLOBIN A1C - Abnormal; Notable for the following components:   Hgb A1c MFr Bld 4.5 (*)    All other components within normal limits  LIPID PANEL - Abnormal; Notable for the following components:   Triglycerides 150 (*)    HDL 25 (*)    All other components within normal limits  I-STAT CHEM 8, ED - Abnormal; Notable for the following components:   Calcium, Ion 1.13 (*)    Hemoglobin 18.0 (*)    HCT 53.0 (*)    All other components within normal limits  RESP PANEL BY RT-PCR (FLU A&B, COVID) ARPGX2  EXPECTORATED SPUTUM ASSESSMENT W GRAM STAIN, RFLX TO RESP C  PROTIME-INR  APTT  DIFFERENTIAL  RAPID URINE DRUG SCREEN, HOSP PERFORMED  ETHANOL  CK  MAGNESIUM  PHOSPHORUS  HIV-1 RNA QUANT-NO REFLEX-BLD  SEDIMENTATION RATE  C-REACTIVE PROTEIN  CBG MONITORING, ED    EKG None  Radiology CT Angio Chest Pulmonary Embolism (PE) W or WO Contrast  Result Date: 07/01/2021 CLINICAL DATA:  Pulmonary embolism suspected, high probability. EXAM: CT ANGIOGRAPHY CHEST WITH CONTRAST TECHNIQUE: Multidetector CT imaging of the chest was performed using the  standard protocol during bolus administration of intravenous contrast. Multiplanar CT image reconstructions and MIPs were obtained to evaluate the vascular anatomy. CONTRAST:  128mL OMNIPAQUE IOHEXOL 350 MG/ML SOLN COMPARISON:  None. FINDINGS: Cardiovascular: The heart is normal in size and there is no pericardial effusion. Mild aortic atherosclerosis without evidence of aneurysm. The pulmonary trunk is normal in caliber. No definite evidence of pulmonary embolism. Evaluation of the segmental arteries is limited due to mixing artifact and respiratory  motion. Mediastinum/Nodes: Calcified nonenlarged lymph nodes are present in the mediastinum and right hilum. No axillary lymphadenopathy. The thyroid gland, trachea, and esophagus are within normal limits. Lungs/Pleura: Scattered ground-glass opacities are noted in the lungs with a peripheral predominance. No effusion or pneumothorax. Upper Abdomen: The spleen is enlarged measuring 14.1 cm in length. Musculoskeletal: Mild degenerative changes in the thoracic spine. No acute osseous abnormality. Review of the MIP images confirms the above findings. IMPRESSION: 1. No definite evidence of pulmonary embolism. 2. Scattered ground-glass opacities in the lungs with a peripheral predominance, possible infectious or inflammatory pneumonitis. 3. Mild splenomegaly. Electronically Signed   By: Brett Fairy M.D.   On: 07/01/2021 22:03   MR ANGIO HEAD WO CONTRAST  Result Date: 07/01/2021 CLINICAL DATA:  Neuro deficit, acute, stroke suspected EXAM: MRI HEAD WITHOUT CONTRAST MRA HEAD WITHOUT CONTRAST MRA NECK WITHOUT CONTRAST TECHNIQUE: Multiplanar, multiecho pulse sequences of the brain and surrounding structures were obtained without intravenous contrast. Angiographic images of the Circle of Willis were obtained using MRA technique without intravenous contrast. Angiographic images of the neck were obtained using MRA technique without intravenous contrast. Carotid stenosis  measurements (when applicable) are obtained utilizing NASCET criteria, using the distal internal carotid diameter as the denominator. COMPARISON:  CT head from the same day. 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 No hydrocephalus, acute hemorrhage, midline shift, mass lesion, or visible extra-axial fluid collection mild atrophy. Vascular: Detailed below. Skull and upper cervical spine: Normal marrow signal. Sinuses/Orbits: Mild paranasal sinus mucosal thickening. Unremarkable orbits. Other: No mastoid effusions. MRA HEAD FINDINGS Anterior circulation: Bilateral intracranial ICAs, MCAs, and ACAs are patent without proximal hemodynamically significant stenosis Posterior circulation: Bilateral intradural vertebral arteries and basilar artery are patent without significant stenosis. Occlusion of the right P1 PCA. Left fetal type PCA. Left PCA is patent without proximal hemodynamically significant stenosis. MRA NECK FINDINGS Right carotid system: Nondiagnostic evaluation of the right common carotid artery origin and proximal common carotid artery due to MRA technique/artifact. No visible significant (greater than 50%) stenosis. Approximately 2 mm inferiorly directed outpouching arising from the supraclinoid right ICA, favor infundibulum over aneurysm given small vessel which probably originates from the tip. Left carotid system: Nondiagnostic evaluation of the left common carotid artery origin and proximal common carotid artery due to MRA technique/artifact. No visible significant (greater than 50%) stenosis. Vertebral arteries: Nondiagnostic evaluation of the proximal vertebral arteries. Also, nondiagnostic evaluation of the vertebral arteries at the C1-C2 level due to in plane flow. The visible vertebral arteries are patent without significant (greater than 50% stenosis. IMPRESSION: MRA head: 1. Right P1 PCA  occlusion. 2. Approximately 2 mm inferiorly directed outpouching arising from the supraclinoid right ICA, favor infundibulum over aneurysm given small vessel near the tip. 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 Slight edema without mass effect. MRA neck: No visible significant (greater than 50%) stenosis with nondiagnostic evaluation proximally, detailed above. Findings discussed with Dr. Livia Snellen via telephone at 6:25 PM. Electronically Signed   By: Margaretha Sheffield M.D.   On: 07/01/2021 18:26   MR ANGIO NECK WO CONTRAST  Result Date: 07/01/2021 CLINICAL DATA:  Neuro deficit, acute, stroke suspected EXAM: MRI HEAD WITHOUT CONTRAST MRA HEAD WITHOUT CONTRAST MRA NECK WITHOUT CONTRAST TECHNIQUE: Multiplanar, multiecho pulse sequences of the brain and surrounding structures were obtained without intravenous contrast. Angiographic images of the Circle of Willis  were obtained using MRA technique without intravenous contrast. Angiographic images of the neck were obtained using MRA technique without intravenous contrast. Carotid stenosis measurements (when applicable) are obtained utilizing NASCET criteria, using the distal internal carotid diameter as the denominator. COMPARISON:  CT head from the same day. 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 No hydrocephalus, acute hemorrhage, midline shift, mass lesion, or visible extra-axial fluid collection mild atrophy. Vascular: Detailed below. Skull and upper cervical spine: Normal marrow signal. Sinuses/Orbits: Mild paranasal sinus mucosal thickening. Unremarkable orbits. Other: No mastoid effusions. MRA HEAD FINDINGS Anterior circulation: Bilateral intracranial ICAs, MCAs, and ACAs are patent without proximal hemodynamically significant stenosis Posterior circulation: Bilateral intradural  vertebral arteries and basilar artery are patent without significant stenosis. Occlusion of the right P1 PCA. Left fetal type PCA. Left PCA is patent without proximal hemodynamically significant stenosis. MRA NECK FINDINGS Right carotid system: Nondiagnostic evaluation of the right common carotid artery origin and proximal common carotid artery due to MRA technique/artifact. No visible significant (greater than 50%) stenosis. Approximately 2 mm inferiorly directed outpouching arising from the supraclinoid right ICA, favor infundibulum over aneurysm given small vessel which probably originates from the tip. Left carotid system: Nondiagnostic evaluation of the left common carotid artery origin and proximal common carotid artery due to MRA technique/artifact. No visible significant (greater than 50%) stenosis. Vertebral arteries: Nondiagnostic evaluation of the proximal vertebral arteries. Also, nondiagnostic evaluation of the vertebral arteries at the C1-C2 level due to in plane flow. The visible vertebral arteries are patent without significant (greater than 50% stenosis. IMPRESSION: MRA head: 1. Right P1 PCA occlusion. 2. Approximately 2 mm inferiorly directed outpouching arising from the supraclinoid right ICA, favor infundibulum over aneurysm given small vessel near the tip. 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 Slight edema without mass effect. MRA neck: No visible significant (greater than 50%) stenosis with nondiagnostic evaluation proximally, detailed above. Findings discussed with Dr. Livia Snellen via telephone at 6:25 PM. Electronically Signed   By: Margaretha Sheffield M.D.   On: 07/01/2021 18:26   MR BRAIN WO CONTRAST  Result Date: 07/01/2021 CLINICAL DATA:  Neuro deficit, acute, stroke suspected EXAM: MRI HEAD WITHOUT CONTRAST MRA HEAD WITHOUT CONTRAST MRA NECK WITHOUT CONTRAST TECHNIQUE: Multiplanar, multiecho pulse sequences of the  brain and surrounding structures were obtained without intravenous contrast. Angiographic images of the Circle of Willis were obtained using MRA technique without intravenous contrast. Angiographic images of the neck were obtained using MRA technique without intravenous contrast. Carotid stenosis measurements (when applicable) are obtained utilizing NASCET criteria, using the distal internal carotid diameter as the denominator. COMPARISON:  CT head from the same day. 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 No hydrocephalus, acute hemorrhage, midline shift, mass lesion, or visible extra-axial fluid collection mild atrophy. Vascular: Detailed below. Skull and upper cervical spine: Normal marrow signal. Sinuses/Orbits: Mild paranasal sinus mucosal thickening. Unremarkable orbits. Other: No mastoid effusions. MRA HEAD FINDINGS Anterior circulation: Bilateral intracranial ICAs, MCAs, and ACAs are patent without proximal hemodynamically significant stenosis Posterior circulation: Bilateral intradural vertebral arteries and basilar artery are patent without significant stenosis. Occlusion of the right P1 PCA. Left fetal type PCA. Left PCA is patent without proximal hemodynamically significant stenosis. MRA NECK FINDINGS Right carotid system: Nondiagnostic evaluation of the right common carotid artery origin  and proximal common carotid artery due to MRA technique/artifact. No visible significant (greater than 50%) stenosis. Approximately 2 mm inferiorly directed outpouching arising from the supraclinoid right ICA, favor infundibulum over aneurysm given small vessel which probably originates from the tip. Left carotid system: Nondiagnostic evaluation of the left common carotid artery origin and proximal common carotid artery due to MRA technique/artifact. No visible significant (greater than 50%) stenosis. Vertebral  arteries: Nondiagnostic evaluation of the proximal vertebral arteries. Also, nondiagnostic evaluation of the vertebral arteries at the C1-C2 level due to in plane flow. The visible vertebral arteries are patent without significant (greater than 50% stenosis. IMPRESSION: MRA head: 1. Right P1 PCA occlusion. 2. Approximately 2 mm inferiorly directed outpouching arising from the supraclinoid right ICA, favor infundibulum over aneurysm given small vessel near the tip. 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 Slight edema without mass effect. MRA neck: No visible significant (greater than 50%) stenosis with nondiagnostic evaluation proximally, detailed above. Findings discussed with Dr. Livia Snellen via telephone at 6:25 PM. Electronically Signed   By: Margaretha Sheffield M.D.   On: 07/01/2021 18:26   DG CHEST PORT 1 VIEW  Result Date: 07/01/2021 CLINICAL DATA:  Dyspnea EXAM: PORTABLE CHEST 1 VIEW COMPARISON:  None. FINDINGS: The lungs are symmetrically expanded. Asymmetric bibasilar reticulonodular infiltrates are present, more severe at the left lung base, likely infectious or inflammatory in the acute setting. No pneumothorax or pleural effusion. Cardiac size within normal limits. No acute bone abnormality. IMPRESSION: Bibasilar reticulonodular infiltrates, likely infectious or inflammatory. Follow-up examination in 3-4 weeks may be helpful in documenting resolution. If persistent, these may be better assessed with high-resolution CT examination at that point. Electronically Signed   By: Fidela Salisbury M.D.   On: 07/01/2021 20:22   CT HEAD CODE STROKE WO CONTRAST  Result Date: 07/01/2021 CLINICAL DATA:  Code stroke. EXAM: CT HEAD WITHOUT CONTRAST TECHNIQUE: Contiguous axial images were obtained from the base of the skull through the vertex without intravenous contrast. COMPARISON:  None. FINDINGS: Brain: No evidence of acute infarction,  hemorrhage, cerebral edema, mass, mass effect, or midline shift. Ventricles and sulci are normal for age. No extra-axial fluid collection. Vascular: No hyperdense vessel or unexpected calcification. Skull: Normal. Negative for fracture or focal lesion. Mucosal thickening in the ethmoid air cells. Hypoplastic right frontal sinus. The orbits are negative.: No acute finding. Other: The mastoid air cells are well aerated. ASPECTS [facility] Stroke Program Early CT Score) - Ganglionic level infarction (caudate, lentiform nuclei, internal capsule, insula, M1-M3 cortex): 7 - Supraganglionic infarction (M4-M6 cortex): 3 Total score (0-10 with 10 being normal): 10 IMPRESSION: 1. No acute intracranial process. 2. ASPECTS is 10 Code stroke imaging results were communicated on 07/01/2021 at 4:49 pm to provider Su Monks via secure text paging. Electronically Signed   By: Merilyn Baba M.D.   On: 07/01/2021 16:49    Procedures .Critical Care Performed by: Emeline Darling, PA-C Authorized by: Emeline Darling, PA-C   Critical care provider statement:    Critical care time (minutes):  45   Critical care was time spent personally by me on the following activities:  Development of treatment plan with patient or surrogate, discussions with consultants, evaluation of patient's response to treatment, examination of patient, obtaining history from patient or surrogate, ordering and performing treatments and interventions, ordering and review of laboratory studies, ordering and review of radiographic studies, pulse oximetry and re-evaluation of patient's condition  Medications Ordered in ED Medications  aspirin EC tablet 81 mg (has no administration in time range)  clopidogrel (PLAVIX) tablet 75 mg (has no administration in time range)  sodium chloride flush (NS) 0.9 % injection 3 mL (3 mLs Intravenous Given 07/01/21 1709)  clopidogrel (PLAVIX) tablet 75 mg (75 mg Oral Given 07/01/21 2018)  aspirin tablet 325  mg (325 mg Oral Given 07/01/21 2018)  iohexol (OMNIPAQUE) 350 MG/ML injection 100 mL (100 mLs Intravenous Contrast Given 07/01/21 2152)    ED Course/ Medical Decision Making/ A&P Clinical Course as of 07/02/21 0108  Thu Jul 01, 2021  1709 Consult to neuro hospitalist APP S. Ebbie Latus, NP who escorted the patient back from the CT scanner.  Last known well turns out was yesterday evening.  Patient woke up this morning with left-sided weakness and facial droop.  Initial CT was negative but patient will require admission to the hospital for completion of stroke work-up.  Not a TNKase candidate given last known well nearly 24 hours ago.  We will proceed with Plavix and aspirin administration per neuro recommendation. [RS]  [id number] Consult to hospitalist, Dr. Roel Cluck, who is agreeable to seeing this patient and admitting him to her service.  I appreciate her collaboration in the care of this patient [RS]    Clinical Course User Index [RS] Costantino Kohlbeck, Gypsy Balsam, PA-C                           Medical Decision Making This patient presents to the ED for concern of CODE STROKE, this involves an extensive number of treatment options, and is a complaint that carries with it a high risk of complications and morbidity.  The differential diagnosis includes CVA, complex migraine, and drug effect.  Patient arrived to the emergency department and admitted the bridge by neurology team who escorted him to CT scan .  At that point he became clear that patient last known well was actually the night prior to his arrival in the ED.  Not a candidate for thrombolytics.  I was able to neuro hospitalist APP only explained significance concern for CVA plan to admit to medicine for completion of stroke work-up.  MRI of the brain and MRA of the head and neck without contrast was ordered per the recommendation.  MRIs concerning for multiple small infarcts in the right PCA territory with right P1 PCA occlusion.  Discussion with  neurologist, Dr. Livia Snellen, neurologist, who recommends continued observation of the patient's symptomatology but no intervention at this time given improving neurologic exam.  She did reexamine the patient herself after MRA.  Recommends close serial neuro examinations overnight with repeat code stroke activation should patient have any decline. Night neuro hospitalist, Dr. Lorrin Goodell aware.  Comorbidities that complicate the patient evaluation: Hypertension-on losartan and HCTZ daily Polycythemia   Additional history obtained:  Additional history obtained from chart review and wife at the bedside External records from outside source obtained and reviewed including none   Lab Tests:  I Ordered, reviewed, and interpreted labs.  The pertinent results include: CBC without leukocytosis but with evidence of polycythemia with hemoglobin of 18.  CMP with mildly elevated total bilirubin to 1.4.  Coags are normal, UDS is pan negative.   Imaging Studies ordered:  I ordered imaging studies including CT of the head as well as MRI brain and MRA of the head and neck I independently visualized and interpreted imaging which showed right PCA LVO with multiple small  infarcts in the area of the right PCA I agree with the radiologist interpretation   Cardiac Monitoring:  The patient was maintained on a cardiac monitor.  I personally viewed and interpreted the cardiac monitored which showed an underlying rhythm of: Sinus rhythm   Medicines ordered and prescription drug management:  I ordered medication including aspirin and Plavix for acute ischemic CVA Reevaluation of the patient after these medicines showed that the patient stayed the same I have reviewed the patients home medicines and have made adjustments as needed  Tests Considered: CTA of the head and neck, however per neurology we will proceed with MRA of the head and neck   Critical Interventions:  Code stroke activation called by EMS,  dual antiplatelet therapy    Consultations Obtained:  I requested consultation with the neurologist and hospitalist as above,  and discussed lab and imaging findings as well as pertinent plan - they recommend: Admission to the hospital for completion of stroke work-up.   Problem List / ED Course: Left-sided weakness, facial droop, and dysarthria; acute CVA Hypertension; permissive hypertension at this time given acute ischemic stroke    Reevaluation:  After the interventions noted above, I reevaluated the patient and found that they have : Stable.  Improving neurologic symptoms from initial intake into the emergency department but remains with left-sided weakness in the upper and lower extremity as well as slight left-sided facial droop and intermittent dysarthria without aphasia.  Social Determinants of Health Lives at home with his wife  Dispostion:  After consideration of the diagnostic results and the patients response to treatment feel that the patent would benefit from admission to the hospital for completion of stroke work-up.  Agree with understanding with medical evaluation and treatment plan.  He was questions answered to his expressed satisfaction.  He is amenable to plan for admission at this time.      This chart was dictated using voice recognition software, Dragon. Despite the best efforts of this provider to proofread and correct errors, errors may still occur which can change documentation meaning.  Final Clinical Impression(s) / ED Diagnoses Final diagnoses:  CVA (cerebral vascular accident) [facility])    Rx / [place] Orders ED Discharge Orders     None         Marine Lezotte, Gypsy Balsam, PA-C [id number]    Lianne Cure, DO [id number]

## 2021-07-01 NOTE — Assessment & Plan Note (Addendum)
-   will admit based on TIA/CVA protocol,        Monitor on Tele       MRA/MR  Resulted - showing acute ischemic CVAs   MRI/MRA - Right P1 PCA occlusion. Multiple small acute infarcts within the right PCA territory, including the right thalamus and occipital lobe. Slight edema without mass effect. No visible significant (greater than 50%) stenosis with nondiagnostic evaluation proximally, detailed above.         Echo to evaluate for possible embolic source,        obtain cardiac enzymes,  ECG,   Lipid panel, TSH.        Order PT/OT evaluation.       passed swallow eval        Will make sure patient is on antiplatelet ASA 81mg  + Plavix 75 mg for 21 d     statin if indicated       Allow permissive Hypertension keep BP <220/120        Neurology consulted Have seen pt in ER

## 2021-07-01 NOTE — Code Documentation (Signed)
Stroke Response Nurse Documentation Code Documentation  Christian Huynh is a 60 y.o. male arriving to [facility] ED via [facility] on 07/01/2021 with past medical hx of hypertension. Code stroke was activated by EMS.   Patient from recently returned to [place] from vacation in [place]. He and his wife drove her today to visit [place] family. LKW when he went to bed last night around 2200. He woke up this morning and noted he felt weak on the left. Around 1000 he noted numbness on the left and began dragging his foot. Wife unaware of these symptoms. At 1530 he fell and EMS was called.   Stroke team at the bedside on patient arrival. Labs drawn and patient cleared for CT by EDP. Patient to CT with team. NIHSS 5, see documentation for details and code stroke times. Patient with left facial droop, left arm weakness, left leg weakness, left decreased sensation, and dysarthria  on exam.   The following imaging was completed: CT. Patient is not a candidate for IV Thrombolytic due to out of the window. Not a candidate for IR thrombectomy due to no LVO.  Care/Plan: admit for stroke workup; Q2 VS/neuro checks x12 then Q4.  Bedside handoff with ED RN Chloe.    Leverne Humbles Stroke Response RN

## 2021-07-02 ENCOUNTER — Inpatient Hospital Stay (HOSPITAL_COMMUNITY): Payer: BLUE CROSS/BLUE SHIELD

## 2021-07-02 DIAGNOSIS — I639 Cerebral infarction, unspecified: Secondary | ICD-10-CM | POA: Diagnosis present

## 2021-07-02 DIAGNOSIS — I6389 Other cerebral infarction: Secondary | ICD-10-CM | POA: Diagnosis not present

## 2021-07-02 DIAGNOSIS — I10 Essential (primary) hypertension: Secondary | ICD-10-CM | POA: Diagnosis not present

## 2021-07-02 DIAGNOSIS — R9389 Abnormal findings on diagnostic imaging of other specified body structures: Secondary | ICD-10-CM | POA: Diagnosis not present

## 2021-07-02 DIAGNOSIS — D751 Secondary polycythemia: Secondary | ICD-10-CM | POA: Diagnosis not present

## 2021-07-02 LAB — COMPREHENSIVE METABOLIC PANEL
ALT: 36 U/L (ref 0–44)
AST: 31 U/L (ref 15–41)
Albumin: 3.3 g/dL — ABNORMAL LOW (ref 3.5–5.0)
Alkaline Phosphatase: 59 U/L (ref 38–126)
Anion gap: 8 (ref 5–15)
BUN: 14 mg/dL (ref 8–23)
CO2: 24 mmol/L (ref 22–32)
Calcium: 8.4 mg/dL — ABNORMAL LOW (ref 8.9–10.3)
Chloride: 100 mmol/L (ref 98–111)
Creatinine, Ser: 1.21 mg/dL (ref 0.61–1.24)
GFR, Estimated: >60 mL/min (ref >60)
Glucose, Bld: 94 mg/dL (ref 70–99)
Potassium: 3.6 mmol/L (ref 3.5–5.1)
Sodium: 132 mmol/L — ABNORMAL LOW (ref 135–145)
Total Bilirubin: 1.4 mg/dL — ABNORMAL HIGH (ref 0.3–1.2)
Total Protein: 6 g/dL — ABNORMAL LOW (ref 6.5–8.1)

## 2021-07-02 LAB — CBC
HCT: 49.1 % (ref 39.0–52.0)
Hemoglobin: 17.1 g/dL — ABNORMAL HIGH (ref 13.0–17.0)
MCH: 31.8 pg (ref 26.0–34.0)
MCHC: 34.8 g/dL (ref 30.0–36.0)
MCV: 91.3 fL (ref 80.0–100.0)
Platelets: 166 10*3/uL (ref 150–400)
RBC: 5.38 MIL/uL (ref 4.22–5.81)
RDW: 12.8 % (ref 11.5–15.5)
WBC: 6.5 10*3/uL (ref 4.0–10.5)
nRBC: 0 % (ref 0.0–0.2)

## 2021-07-02 LAB — RESPIRATORY PANEL BY PCR

## 2021-07-02 LAB — ECHOCARDIOGRAM COMPLETE
AR max vel: 2.16 cm2
AV Area VTI: 1.88 cm2
AV Area mean vel: 1.92 cm2
AV Mean grad: 2 mmHg
AV Peak grad: 3.6 mmHg
Ao pk vel: 0.96 m/s
Area-P 1/2: 2.99 cm2
Calc EF: 53.2 %
Height: 69 in
MV VTI: 0.23 cm2
P 1/2 time: 784 msec
S' Lateral: 3.2 cm
Single Plane A2C EF: 52.8 %
Single Plane A4C EF: 52.4 %
Weight: 3136 oz

## 2021-07-02 LAB — HIV ANTIBODY (ROUTINE TESTING W REFLEX): HIV Screen 4th Generation wRfx: NONREACTIVE

## 2021-07-02 MED ORDER — ASPIRIN EC 325 MG PO TBEC
325.0000 mg | DELAYED_RELEASE_TABLET | Freq: Every day | ORAL | Status: DC
  Administered 2021-07-03: 325 mg via ORAL
  Filled 2021-07-02: qty 1

## 2021-07-02 MED ORDER — ACETAMINOPHEN 160 MG/5ML PO SOLN: 650.0000 mg | ORAL | Status: DC | PRN

## 2021-07-02 MED ORDER — ACETAMINOPHEN 650 MG RE SUPP: 650.0000 mg | RECTAL | Status: DC | PRN

## 2021-07-02 MED ORDER — SODIUM CHLORIDE 0.9 % IV SOLN: INTRAVENOUS | Status: DC

## 2021-07-02 MED ORDER — ACETAMINOPHEN 325 MG PO TABS
650.0000 mg | ORAL_TABLET | ORAL | Status: DC | PRN
  Administered 2021-07-02: 650 mg via ORAL
  Filled 2021-07-02: qty 2

## 2021-07-02 MED ORDER — STROKE: EARLY STAGES OF RECOVERY BOOK
Freq: Once | Status: AC
  Filled 2021-07-02: qty 2

## 2021-07-02 MED ORDER — ATORVASTATIN CALCIUM 40 MG PO TABS
40.0000 mg | ORAL_TABLET | Freq: Every day | ORAL | Status: DC
  Administered 2021-07-02 – 2021-07-03 (×2): 40 mg via ORAL
  Filled 2021-07-02 (×2): qty 1

## 2021-07-02 NOTE — Progress Notes (Signed)
° °  Echocardiogram 2D Echocardiogram has been performed.  Christian Huynh 07/02/2021, 11:00 AM

## 2021-07-02 NOTE — Evaluation (Signed)
Occupational Therapy Evaluation Patient Details Name: Christian Huynh MRN: [id number] DOB: Nov 08, 1960 Today's Date: 07/02/2021   History of Present Illness 61 y.o. male admitted 07/01/21 with complaints of left-sided weakness Arm>leg and face. 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 PMH includes HTN.   Clinical Impression   Pt is typically independent in ADL and mobility. [place] a lot for work (full time), drives, lives in [place] - visiting family in [place]. Today he presents with LUE weakness and motor coordination deficits. Pt was able to don/doff his socks, perform standing grooming with balance assist, Pt required min to mod A for mobility without DME due to multiple LOB requiring therapist intervention. He demonstrated decreased safety awareness during these LOB. At this time recommend continued acute care OT and neuro outpatient OT follow up. Next session establish HEP for gross and fine motor.     Recommendations for follow up therapy are one component of a multi-disciplinary discharge planning process, led by the attending physician.  Recommendations may be updated based on patient status, additional functional criteria and insurance authorization.   Follow Up Recommendations  Outpatient OT (Neuro Outpatient OT Follow up)    Assistance Recommended at Discharge Frequent or constant Supervision/Assistance (during mobility)  Patient can return home with the following A little help with walking and/or transfers;A little help with bathing/dressing/bathroom;Direct supervision/assist for medications management;Direct supervision/assist for financial management;Assist for transportation;Help with stairs or ramp for entrance    Functional Status Assessment  Patient has had a recent decline in their functional status and demonstrates the ability to make significant improvements in function in a reasonable and  predictable amount of time.  Equipment Recommendations  BSC/3in1 (to be used a shower chair)    Recommendations for Other Services PT consult     Precautions / Restrictions Precautions Precautions: Fall Restrictions Weight Bearing Restrictions: No      Mobility Bed Mobility Overal bed mobility: Needs Assistance Bed Mobility: Supine to Sit     Supine to sit: Min assist;HOB elevated     General bed mobility comments: initially needed cues for sequencing bed mobility.    Transfers Overall transfer level: Needs assistance Equipment used: None Transfers: Sit to/from Stand Sit to Stand: Min assist;From elevated surface           General transfer comment: balance assist more than power      Balance Overall balance assessment: Needs assistance Sitting-balance support: No upper extremity supported;Feet unsupported Sitting balance-Leahy Scale: Fair Sitting balance - Comments: able to demonstrate dynamic leans for donning socks   Standing balance support: Single extremity supported;During functional activity Standing balance-Leahy Scale: Poor Standing balance comment: multiple LOB especially when challenged with direction changes and little awareness - requiring up to mod A at times to prevent falls                           ADL either performed or assessed with clinical judgement   ADL Overall ADL's : Needs assistance/impaired     Grooming: Min guard;Minimal assistance;Standing Grooming Details (indicate cue type and reason): for standing balance Upper Body Bathing: Min guard   Lower Body Bathing: Min guard;Sitting/lateral leans   Upper Body Dressing : Sitting;Set up   Lower Body Dressing: Min guard;Sitting/lateral leans Lower Body Dressing Details (indicate cue type and reason): able to doff and don socks sitting edge of stretcher Toilet Transfer: Moderate assistance;Minimal assistance;Ambulation Toilet Transfer  Details (indicate cue type and  reason): multiple LOB as LLE drags and causes LOB Pt with little awareness Toileting- Clothing Manipulation and Hygiene: Min guard;Sit to/from stand   Tub/ Engineer, structural: Minimal assistance   Functional mobility during ADLs: Moderate assistance;Minimal assistance;Cueing for safety;Cueing for sequencing General ADL Comments: fine motor deficits with LUE and multiple LOB with ambulation requiring therapist intervention with gait belt     Vision Baseline Vision/History: 1 Wears glasses (reading only) Ability to See in Adequate Light: 0 Adequate Patient Visual Report: No change from baseline Vision Assessment?: Yes;No apparent visual deficits Additional Comments: tested [facility]     Perception     Praxis      Pertinent Vitals/Pain Pain Assessment: No/denies pain     Hand Dominance Right   Extremity/Trunk Assessment Upper Extremity Assessment Upper Extremity Assessment: LUE deficits/detail LUE Deficits / Details: non-dominant, increased time and effort for finger to thumb, grasp grossly 4/5, reports sensation is WFL LUE Sensation: decreased proprioception (when reaching for grab bar in bathroom) LUE Coordination: decreased fine motor;decreased gross motor   Lower Extremity Assessment Lower Extremity Assessment: Defer to PT evaluation   Cervical / Trunk Assessment Cervical / Trunk Assessment: Normal   Communication Communication Communication: No difficulties   Cognition Arousal/Alertness: Awake/alert Behavior During Therapy: Flat affect Overall Cognitive Status: Impaired/Different from baseline Area of Impairment: Safety/judgement;Awareness                         Safety/Judgement: Decreased awareness of safety;Decreased awareness of deficits Awareness: Emergent   General Comments: Pt with multiple LOB requiring therapist intervention and no awareness     General Comments  wife present. They are from [place] ([place] area[place] and were visiting family. They just  got a chocolate lab puppy (hank) and work in [employer]      Home Living Family/patient expects to be discharged to:: Private residence Living Arrangements: Spouse/significant other Available Help at Discharge: Family Type of Home: House Home Access: Level entry     Home Layout: One level     Bathroom Shower/Tub: Tub/shower unit;Walk-in shower   Bathroom Toilet: Handicapped height     Home Equipment: Shower seat          Prior Functioning/Environment Prior Level of Function : Independent/Modified Independent;Driving;Working/employed                        OT Problem List: Decreased strength;Impaired balance (sitting and/or standing);Decreased coordination;Decreased safety awareness;Impaired UE functional use      OT Treatment/Interventions: Self-care/ADL training;Therapeutic exercise;Neuromuscular education;DME and/or AE instruction;Therapeutic activities;Patient/family education;Balance training    OT Goals(Current goals can be found in the care plan section) Acute Rehab OT Goals Patient Stated Goal: to get home and get better OT Goal Formulation: With patient/family Time For Goal Achievement: 07/16/21 Potential to Achieve Goals: Good ADL Goals Pt Will Perform Grooming: with modified independence;standing Pt Will Perform Upper Body Dressing: Independently;sitting Pt Will Perform Lower Body Dressing: with modified independence;sit to/from stand Pt Will Transfer to Toilet: with modified independence;ambulating Pt Will Perform Toileting - Clothing Manipulation and hygiene: with modified independence;sit to/from stand Pt/caregiver will Perform Home Exercise Program: Right Upper extremity;With theraputty;With written HEP provided Additional ADL Goal #1: Pt will verbalize 3 strategies for fall prevention with no cues  OT Frequency: Min 2X/week    Co-evaluation PT/OT/SLP Co-Evaluation/Treatment: Yes Reason for  Co-Treatment: To address functional/ADL transfers;For  patient/therapist safety PT goals addressed during session: Mobility/safety with mobility;Balance;Strengthening/ROM OT goals addressed during session: ADL's and self-care;Strengthening/ROM      AM-PAC OT "6 Clicks" Daily Activity     Outcome Measure Help from another person eating meals?: None Help from another person taking care of personal grooming?: A Little Help from another person toileting, which includes using toliet, bedpan, or urinal?: A Little Help from another person bathing (including washing, rinsing, drying)?: A Little Help from another person to put on and taking off regular upper body clothing?: None Help from another person to put on and taking off regular lower body clothing?: A Little 6 Click Score: 20   End of Session Equipment Utilized During Treatment: Gait belt Nurse Communication: Mobility status  Activity Tolerance: Patient tolerated treatment well Patient left: in bed;with call bell/phone within reach;with family/visitor present  OT Visit Diagnosis: Unsteadiness on feet (R26.81);Muscle weakness (generalized) (M62.81);Other symptoms and signs involving cognitive function;Hemiplegia and hemiparesis Hemiplegia - Right/Left: Left Hemiplegia - dominant/non-dominant: Non-Dominant Hemiplegia - caused by: Cerebral infarction                Time: 1130-1154 OT Time Calculation (min): 24 min Charges:  OT General Charges $OT Visit: 1 Visit OT Evaluation $OT Eval Moderate Complexity: 1 Mod Nyoka CowdenLaura H OTR/L Acute Rehabilitation Services Pager: [phone] Office: [phone]  Evern BioLaura J Sue Mcalexander 07/02/2021, 1:56 PM

## 2021-07-02 NOTE — Plan of Care (Signed)
°  Problem: Education: Goal: Knowledge of disease or condition will improve Outcome: Progressing Goal: Knowledge of secondary prevention will improve (SELECT ALL) Outcome: Progressing Goal: Knowledge of patient specific risk factors will improve (INDIVIDUALIZE FOR PATIENT) Outcome: Progressing   Problem: Coping: Goal: Will verbalize positive feelings about self Outcome: Progressing   Problem: Self-Care: Goal: Ability to participate in self-care as condition permits will improve Outcome: Progressing   Problem: Nutrition: Goal: Risk of aspiration will decrease Outcome: Progressing   Problem: Ischemic Stroke/TIA Tissue Perfusion: Goal: Complications of ischemic stroke/TIA will be minimized Outcome: Progressing

## 2021-07-02 NOTE — Progress Notes (Signed)
[facility] PROGRESS NOTE   Christian Huynh UXY:[id number] DOB: 01/24/1961 DOA: 07/01/2021  PCP: Pcp, No  Brief History/Interval Summary: 61 y.o. male with medical history significant of hypertension presented with complaints of left-sided weakness.  Patient is originally from [place] and is visiting family members here in town.  Code stroke was called.  Patient was seen by neurologist.  Hospitalized for further management.  Reason for Visit: Stroke  Consultants: Neurology  Procedures: Transthoracic echocardiogram    Subjective/Interval History: Patient continues to have left-sided weakness, arm more than leg.  Feels numbness in his face on the left side.     Assessment/Plan:  Acute stroke MRI brain showed multiple small acute infarcts within the right PCA territory.  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  Findings suggestive of either infundibulum or aneurysm also noted.  MRA neck did not show any significant stenosis. Neurology is following.  Await input from stroke team. Patient is currently on aspirin and Plavix. LDL is 88.  He will need to be started on statin.  HbA1c 4.5. EKG did not show any arrhythmias. Transthoracic echocardiogram is pending. PT and OT evaluations pending.  He passed a swallow screen.  Abnormal chest imaging Bibasilar reticulonodular infiltrates noted on chest x-ray.  CT angiogram shows patchy opacities.  Patient denies any respiratory symptoms.  No recent illness.  His COVID-19 and influenza PCR were negative.  He is afebrile.  WBC is normal.  Denies any history of smoking.  Denies any history of any other lung disease.  Since he is otherwise stable this can be worked up further in the outpatient setting. Concern raised for inflammatory condition.  We will check ESR CRP. Findings discussed with patient.  He plans to return to [place] and will follow-up with his providers there  Essential hypertension On losartan and hydrochlorothiazide at  home.  Currently being held for permissive hypertension.  Polycythemia Noted to have elevated hemoglobin.  This may need further work-up in the outpatient setting.  Mild hyponatremia Monitor.  DVT Prophylaxis: Currently on SCDs.  Initiate Lovenox. Code Status: Full code Family Communication: Discussed with patient.  No family at bedside Disposition Plan: To be determined  Status is: Inpatient  Remains inpatient appropriate because: Acute stroke     Medications: Scheduled:  aspirin EC  81 mg Oral Daily   clopidogrel  75 mg Oral Daily   Continuous:  sodium chloride 75 mL/hr at 07/02/21 0829   TYO:[id number] **OR** acetaminophen (TYLENOL) oral liquid 160 mg/5 mL **OR** acetaminophen  Antibiotics: Anti-infectives (From admission, onward)    None       Objective:  Vital Signs  Vitals:   07/02/21 0316 07/02/21 0615 07/02/21 0845 07/02/21 1130  BP: 132/83 129/84 135/90 120/66  Pulse: 67 68 71 69  Resp: _0 (!) 22  Temp:      TempSrc:      SpO2: 93% 93% 94% 93%  Weight:      Height:       No intake or output data in the 24 hours ending 07/02/21 1204 Filed Weights   07/01/21 1654  Weight: 88.9 kg    General appearance: Awake alert.  In no distress Resp: Clear to auscultation bilaterally.  Normal effort Cardio: S1-S2 is normal regular.  No S3-S4.  No rubs murmurs or bruit GI: Abdomen is soft.  Nontender nondistended.  Bowel sounds are present normal.  No masses organomegaly Extremities: No edema.  Full range of motion of lower extremities. Neurologic: Alert and oriented  Left-sided weakness noted upper extremity.  No facial asymmetry. ° ° °Lab Results: ° °Data Reviewed: I have personally reviewed following labs and imaging studies ° °CBC: °Recent Labs  °Lab 07/01/21 °1632 07/01/21 °1638 07/02/21 °0757  °WBC 6.6  --  6.5  °NEUTROABS 3.7  --   --   °HGB 18.4* 18.0* 17.1*  °HCT 54.0* 53.0* 49.1  °MCV 92.0  --  91.3  °PLT 174  --  166  ° ° °Basic Metabolic  Panel: °Recent Labs  °Lab 07/01/21 °1632 07/01/21 °1638 07/02/21 °0757  °NA 138 140 132*  °K 4.1 4.1 3.6  °CL 102 103 100  °CO2 26  --  24  °GLUCOSE 94 91 94  °BUN 15 18 14  °CREATININE 1.21 1.10 1.21  °CALCIUM 9.3  --  8.4*  ° ° °GFR: °Estimated Creatinine Clearance: 70.7 mL/min (by C-G formula based on SCr of 1.21 mg/dL). ° °Liver Function Tests: °Recent Labs  °Lab 07/01/21 °1632 07/02/21 °0757  °AST 40 31  °ALT 43 36  °ALKPHOS 70 59  °BILITOT 1.4* 1.4*  °PROT 7.1 6.0*  °ALBUMIN 4.0 3.3*  ° ° °Coagulation Profile: °Recent Labs  °Lab 07/01/21 °1632  °INR 1.1  ° ° ° °HbA1C: °Recent Labs  °  07/01/21 °1630  °HGBA1C 4.5*  ° ° °CBG: °Recent Labs  °Lab 07/01/21 °1633  °GLUCAP 92  ° ° °Lipid Profile: °Recent Labs  °  07/01/21 °1630  °CHOL 143  °HDL 25*  °LDLCALC 88  °TRIG 150*  °CHOLHDL 5.7  ° ° ° °Recent Results (from the past 240 hour(s))  °Resp Panel by RT-PCR (Flu A&B, Covid) Nasopharyngeal Swab     Status: None  ° Collection Time: 07/01/21  5:11 PM  ° Specimen: Nasopharyngeal Swab; Nasopharyngeal(NP) swabs in vial transport medium  °Result Value Ref Range Status  ° SARS Coronavirus 2 by RT PCR NEGATIVE NEGATIVE Final  °  Comment: (NOTE) °SARS-CoV-2 target nucleic acids are NOT DETECTED. ° °The SARS-CoV-2 RNA is generally detectable in upper respiratory °specimens during the acute phase of infection. The lowest °concentration of SARS-CoV-2 viral copies this assay can detect is °138 copies/mL. A negative result does not preclude SARS-Cov-2 °infection and should not be used as the sole basis for treatment or °other patient management decisions. A negative result may occur with  °improper specimen collection/handling, submission of specimen other °than nasopharyngeal swab, presence of viral mutation(s) within the °areas targeted by this assay, and inadequate number of viral °copies(<138 copies/mL). A negative result must be combined with °clinical observations, patient history, and epidemiological °information. The  expected result is Negative. ° °Fact Sheet for Patients:  °https://www.fda.gov/media/152166/download ° °Fact Sheet for Healthcare Providers:  °https://www.fda.gov/media/152162/download ° °This test is no t yet approved or cleared by the [place] FDA and  °has been authorized for detection and/or diagnosis of SARS-CoV-2 by °FDA under an Emergency Use Authorization (EUA). This EUA will remain  °in effect (meaning this test can be used) for the duration of the °COVID-19 declaration under Section 564(b)(1) of the Act, 21 °U.S.C.section 360bbb-3(b)(1), unless the authorization is terminated  °or revoked sooner.  ° ° °  ° Influenza A by PCR NEGATIVE NEGATIVE Final  ° Influenza B by PCR NEGATIVE NEGATIVE Final  °  Comment: (NOTE) °The Xpert Xpress SARS-CoV-2/FLU/RSV plus assay is intended as an aid °in the diagnosis of influenza from Nasopharyngeal swab specimens and °should not be used as a sole basis for treatment. Nasal washings and °aspirates are unacceptable for Xpert Xpress   SARS-CoV-2/FLU/RSV °testing. ° °Fact Sheet for Patients: °https://www.fda.gov/media/152166/download ° °Fact Sheet for Healthcare Providers: °https://www.fda.gov/media/152162/download ° °This test is not yet approved or cleared by the [place] FDA and °has been authorized for detection and/or diagnosis of SARS-CoV-2 by °FDA under an Emergency Use Authorization (EUA). This EUA will remain °in effect (meaning this test can be used) for the duration of the °COVID-19 declaration under Section 564(b)(1) of the Act, 21 U.S.C. °section 360bbb-3(b)(1), unless the authorization is terminated or °revoked. ° °Performed at [facility], [address] [place], [place] °[address] °  °Respiratory (~20 pathogens) panel by PCR     Status: None  ° Collection Time: 07/02/21  2:17 AM  ° Specimen: Nasopharyngeal Swab; Respiratory  °Result Value Ref Range Status  ° Adenovirus NOT DETECTED NOT DETECTED Final  ° Coronavirus 229E NOT DETECTED NOT DETECTED  Final  °  Comment: (NOTE) °The Coronavirus on the Respiratory Panel, DOES NOT test for the novel  °Coronavirus (2019 nCoV) °  ° Coronavirus HKU1 NOT DETECTED NOT DETECTED Final  ° Coronavirus NL63 NOT DETECTED NOT DETECTED Final  ° Coronavirus OC43 NOT DETECTED NOT DETECTED Final  ° Metapneumovirus NOT DETECTED NOT DETECTED Final  ° Rhinovirus / Enterovirus NOT DETECTED NOT DETECTED Final  ° Influenza A NOT DETECTED NOT DETECTED Final  ° Influenza B NOT DETECTED NOT DETECTED Final  ° Parainfluenza Virus 1 NOT DETECTED NOT DETECTED Final  ° Parainfluenza Virus 2 NOT DETECTED NOT DETECTED Final  ° Parainfluenza Virus 3 NOT DETECTED NOT DETECTED Final  ° Parainfluenza Virus 4 NOT DETECTED NOT DETECTED Final  ° Respiratory Syncytial Virus NOT DETECTED NOT DETECTED Final  ° Bordetella pertussis NOT DETECTED NOT DETECTED Final  ° Bordetella Parapertussis NOT DETECTED NOT DETECTED Final  ° Chlamydophila pneumoniae NOT DETECTED NOT DETECTED Final  ° Mycoplasma pneumoniae NOT DETECTED NOT DETECTED Final  °  Comment: Performed at [facility], [address] [place], [place] [address]  °  ° ° °Radiology Studies: °CT Angio Chest Pulmonary Embolism (PE) W or WO Contrast ° °Result Date: 07/01/2021 °CLINICAL DATA:  Pulmonary embolism suspected, high probability. EXAM: CT ANGIOGRAPHY CHEST WITH CONTRAST TECHNIQUE: Multidetector CT imaging of the chest was performed using the standard protocol during bolus administration of intravenous contrast. Multiplanar CT image reconstructions and MIPs were obtained to evaluate the vascular anatomy. CONTRAST:  100mL OMNIPAQUE IOHEXOL 350 MG/ML SOLN COMPARISON:  None. FINDINGS: Cardiovascular: The heart is normal in size and there is no pericardial effusion. Mild aortic atherosclerosis without evidence of aneurysm. The pulmonary trunk is normal in caliber. No definite evidence of pulmonary embolism. Evaluation of the segmental arteries is limited due to mixing artifact and respiratory  motion. Mediastinum/Nodes: Calcified nonenlarged lymph nodes are present in the mediastinum and right hilum. No axillary lymphadenopathy. The thyroid gland, trachea, and esophagus are within normal limits. Lungs/Pleura: Scattered ground-glass opacities are noted in the lungs with a peripheral predominance. No effusion or pneumothorax. Upper Abdomen: The spleen is enlarged measuring 14.1 cm in length. Musculoskeletal: Mild degenerative changes in the thoracic spine. No acute osseous abnormality. Review of the MIP images confirms the above findings. IMPRESSION: 1. No definite evidence of pulmonary embolism. 2. Scattered ground-glass opacities in the lungs with a peripheral predominance, possible infectious or inflammatory pneumonitis. 3. Mild splenomegaly. Electronically Signed   By: Laura  Taylor M.D.   On: 07/01/2021 22:03  ° °MR ANGIO HEAD WO CONTRAST ° °Result Date: 07/01/2021 °CLINICAL DATA:  Neuro deficit, acute, stroke suspected EXAM:   MRI HEAD WITHOUT CONTRAST MRA HEAD WITHOUT CONTRAST MRA NECK WITHOUT CONTRAST TECHNIQUE: Multiplanar, multiecho pulse sequences of the brain and surrounding structures were obtained without intravenous contrast. Angiographic images of the Circle of Willis were obtained using MRA technique without intravenous contrast. Angiographic images of the neck were obtained using MRA technique without intravenous contrast. Carotid stenosis measurements (when applicable) are obtained utilizing NASCET criteria, using the distal internal carotid diameter as the denominator. COMPARISON:  CT head from the same day. 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 No hydrocephalus, acute hemorrhage, midline shift, mass lesion, or visible extra-axial fluid collection mild atrophy. Vascular: Detailed below. Skull and upper cervical spine: Normal marrow signal. Sinuses/Orbits: Mild paranasal sinus mucosal  thickening. Unremarkable orbits. Other: No mastoid effusions. MRA HEAD FINDINGS Anterior circulation: Bilateral intracranial ICAs, MCAs, and ACAs are patent without proximal hemodynamically significant stenosis Posterior circulation: Bilateral intradural vertebral arteries and basilar artery are patent without significant stenosis. Occlusion of the right P1 PCA. Left fetal type PCA. Left PCA is patent without proximal hemodynamically significant stenosis. MRA NECK FINDINGS Right carotid system: Nondiagnostic evaluation of the right common carotid artery origin and proximal common carotid artery due to MRA technique/artifact. No visible significant (greater than 50%) stenosis. Approximately 2 mm inferiorly directed outpouching arising from the supraclinoid right ICA, favor infundibulum over aneurysm given small vessel which probably originates from the tip. Left carotid system: Nondiagnostic evaluation of the left common carotid artery origin and proximal common carotid artery due to MRA technique/artifact. No visible significant (greater than 50%) stenosis. Vertebral arteries: Nondiagnostic evaluation of the proximal vertebral arteries. Also, nondiagnostic evaluation of the vertebral arteries at the C1-C2 level due to in plane flow. The visible vertebral arteries are patent without significant (greater than 50% stenosis. IMPRESSION: MRA head: 1. Right P1 PCA occlusion. 2. Approximately 2 mm inferiorly directed outpouching arising from the supraclinoid right ICA, favor infundibulum over aneurysm given small vessel near the tip. 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 Slight edema without mass effect. MRA neck: No visible significant (greater than 50%) stenosis with nondiagnostic evaluation proximally, detailed above. Findings discussed with Dr. Stacks via telephone at 6:25 PM. Electronically Signed   By: Frederick S Jones M.D.   On:  07/01/2021 18:26  ° °MR ANGIO NECK WO CONTRAST ° °Result Date: 07/01/2021 °CLINICAL DATA:  Neuro deficit, acute, stroke suspected EXAM: MRI HEAD WITHOUT CONTRAST MRA HEAD WITHOUT CONTRAST MRA NECK WITHOUT CONTRAST TECHNIQUE: Multiplanar, multiecho pulse sequences of the brain and surrounding structures were obtained without intravenous contrast. Angiographic images of the Circle of Willis were obtained using MRA technique without intravenous contrast. Angiographic images of the neck were obtained using MRA technique without intravenous contrast. Carotid stenosis measurements (when applicable) are obtained utilizing NASCET criteria, using the distal internal carotid diameter as the denominator. COMPARISON:  CT head from the same day. 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 No hydrocephalus, acute hemorrhage, midline shift, mass lesion, or visible extra-axial fluid collection mild atrophy. Vascular: Detailed below. Skull and upper cervical spine: Normal marrow signal. Sinuses/Orbits: Mild paranasal sinus mucosal thickening. Unremarkable orbits. Other: No mastoid effusions. MRA HEAD FINDINGS Anterior circulation: Bilateral intracranial ICAs, MCAs, and ACAs are patent without proximal hemodynamically significant stenosis Posterior circulation: Bilateral intradural vertebral arteries and basilar artery are patent without   patent without significant stenosis. Occlusion of the right P1 PCA. Left fetal type PCA. Left PCA is patent without proximal hemodynamically significant stenosis. MRA NECK FINDINGS Right carotid system: Nondiagnostic evaluation of the right common carotid artery origin and proximal common carotid artery due to MRA technique/artifact. No visible significant (greater than 50%) stenosis. Approximately 2 mm inferiorly directed outpouching arising from the supraclinoid right ICA, favor infundibulum over aneurysm given  small vessel which probably originates from the tip. Left carotid system: Nondiagnostic evaluation of the left common carotid artery origin and proximal common carotid artery due to MRA technique/artifact. No visible significant (greater than 50%) stenosis. Vertebral arteries: Nondiagnostic evaluation of the proximal vertebral arteries. Also, nondiagnostic evaluation of the vertebral arteries at the C1-C2 level due to in plane flow. The visible vertebral arteries are patent without significant (greater than 50% stenosis. IMPRESSION: MRA head: 1. Right P1 PCA occlusion. 2. Approximately 2 mm inferiorly directed outpouching arising from the supraclinoid right ICA, favor infundibulum over aneurysm given small vessel near the tip. 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 Slight edema without mass effect. MRA neck: No visible significant (greater than 50%) stenosis with nondiagnostic evaluation proximally, detailed above. Findings discussed with Dr. Livia Snellen via telephone at 6:25 PM. Electronically Signed   By: Margaretha Sheffield M.D.   On: 07/01/2021 18:26   MR BRAIN WO CONTRAST  Result Date: 07/01/2021 CLINICAL DATA:  Neuro deficit, acute, stroke suspected EXAM: MRI HEAD WITHOUT CONTRAST MRA HEAD WITHOUT CONTRAST MRA NECK WITHOUT CONTRAST TECHNIQUE: Multiplanar, multiecho pulse sequences of the brain and surrounding structures were obtained without intravenous contrast. Angiographic images of the Circle of Willis were obtained using MRA technique without intravenous contrast. Angiographic images of the neck were obtained using MRA technique without intravenous contrast. Carotid stenosis measurements (when applicable) are obtained utilizing NASCET criteria, using the distal internal carotid diameter as the denominator. COMPARISON:  CT head from the same day. FINDINGS: MRI HEAD FINDINGS Brain: Multiple small acute infarcts within the right PCA  territory, including the right thalamus and occipital lobe. No hydrocephalus, acute hemorrhage, midline shift, mass lesion, or visible extra-axial fluid collection mild atrophy. Vascular: Detailed below. Skull and upper cervical spine: Normal marrow signal. Sinuses/Orbits: Mild paranasal sinus mucosal thickening. Unremarkable orbits. Other: No mastoid effusions. MRA HEAD FINDINGS Anterior circulation: Bilateral intracranial ICAs, MCAs, and ACAs are patent without proximal hemodynamically significant stenosis Posterior circulation: Bilateral intradural vertebral arteries and basilar artery are patent without significant stenosis. Occlusion of the right P1 PCA. Left fetal type PCA. Left PCA is patent without proximal hemodynamically significant stenosis. MRA NECK FINDINGS Right carotid system: Nondiagnostic evaluation of the right common carotid artery origin and proximal common carotid artery due to MRA technique/artifact. No visible significant (greater than 50%) stenosis. Approximately 2 mm inferiorly directed outpouching arising from the supraclinoid right ICA, favor infundibulum over aneurysm given small vessel which probably originates from the tip. Left carotid system: Nondiagnostic evaluation of the left common carotid artery origin and proximal common carotid artery due to MRA technique/artifact. No visible significant (greater than 50%) stenosis. Vertebral arteries: Nondiagnostic evaluation of the proximal vertebral arteries. Also, nondiagnostic evaluation of the vertebral arteries at the C1-C2 level due to in plane flow. The visible vertebral arteries are patent without significant (greater than 50% stenosis. IMPRESSION: MRA head: 1. Right P1 PCA occlusion. 2. Approximately 2 mm inferiorly directed outpouching arising from the supraclinoid right ICA, favor infundibulum over aneurysm given small vessel near  MRI: Multiple small acute infarcts within the right PCA territory, including the right  thalamus and occipital lobe. Slight edema without mass effect. MRA neck: No visible significant (greater than 50%) stenosis with nondiagnostic evaluation proximally, detailed above. Findings discussed with Dr. Stacks via telephone at 6:25 PM. Electronically Signed   By: Frederick S Jones M.D.   On: 07/01/2021 18:26  ° °DG CHEST PORT 1 VIEW ° °Result Date: 07/01/2021 °CLINICAL DATA:  Dyspnea EXAM: PORTABLE CHEST 1 VIEW COMPARISON:  None. FINDINGS: The lungs are symmetrically expanded. Asymmetric bibasilar reticulonodular infiltrates are present, more severe at the left lung base, likely infectious or inflammatory in the acute setting. No pneumothorax or pleural effusion. Cardiac size within normal limits. No acute bone abnormality. IMPRESSION: Bibasilar reticulonodular infiltrates, likely infectious or inflammatory. Follow-up examination in 3-4 weeks may be helpful in documenting resolution. If persistent, these may be better assessed with high-resolution CT examination at that point. Electronically Signed   By: Ashesh  Parikh M.D.   On: 07/01/2021 20:22  ° °CT HEAD CODE STROKE WO CONTRAST ° °Result Date: 07/01/2021 °CLINICAL DATA:  Code stroke. EXAM: CT HEAD WITHOUT CONTRAST TECHNIQUE: Contiguous axial images were obtained from the base of the skull through the vertex without intravenous contrast. COMPARISON:  None. FINDINGS: Brain: No evidence of acute infarction, hemorrhage, cerebral edema, mass, mass effect, or midline shift. Ventricles and sulci are normal for age. No extra-axial fluid collection. Vascular: No hyperdense vessel or unexpected calcification. Skull: Normal. Negative for fracture or focal lesion. Mucosal thickening in the ethmoid air cells. Hypoplastic right frontal sinus. The orbits are negative.: No acute finding. Other: The mastoid air cells are well aerated. ASPECTS ([place] Stroke Program Early CT Score) - Ganglionic level infarction (caudate, lentiform nuclei, internal capsule, insula, M1-M3  cortex): 7 - Supraganglionic infarction (M4-M6 cortex): 3 Total score (0-10 with 10 being normal): 10 IMPRESSION: 1. No acute intracranial process. 2. ASPECTS is 10 Code stroke imaging results were communicated on 07/01/2021 at 4:49 pm to provider Colleen Stack via secure text paging. Electronically Signed   By: Alison  Vasan M.D.   On: 07/01/2021 16:49   ° ° ° ° LOS: 1 day  ° °Gokul Krishnan ° °[facility] °Pager on [email or web address] ° °07/02/2021, 12:04 PM ° ° °

## 2021-07-02 NOTE — Progress Notes (Signed)
PT Evaluation Note   07/02/21 1442  PT Visit Information  Last PT Received On 07/02/21  Assistance Needed +1  PT/OT/SLP Co-Evaluation/Treatment Yes  Reason for Co-Treatment For patient/therapist safety;To address functional/ADL transfers  PT goals addressed during session Balance;Mobility/safety with mobility  History of Present Illness 61 y.o. male admitted 07/01/21 with complaints of left-sided weakness Arm>leg and face. 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 PMH includes HTN.  Precautions  Precautions Fall  Restrictions  Weight Bearing Restrictions No  Home Living  Family/patient expects to be discharged to: Private residence  Living Arrangements Spouse/significant other  Available Help at Discharge Family  Type of Home House  Home Access Level entry  Home Layout One level  Bathroom Shower/Tub Tub/shower unit;Walk-in shower  Bathroom Toilet Handicapped height  Home Equipment Shower seat  Prior Function  Prior Level of Function  Independent/Modified Independent;Driving;Working/employed  Geneticist, molecular No difficulties  Pain Assessment  Pain Assessment No/denies pain  Cognition  Arousal/Alertness Awake/alert  Behavior During Therapy Flat affect  Overall Cognitive Status Impaired/Different from baseline  Area of Impairment Safety/judgement;Awareness  Safety/Judgement Decreased awareness of safety;Decreased awareness of deficits  Awareness Emergent  General Comments Pt with multiple LOB requiring therapist intervention and no awareness  Upper Extremity Assessment  Upper Extremity Assessment Defer to OT evaluation  Lower Extremity Assessment  Lower Extremity Assessment LLE deficits/detail  LLE Deficits / Details Decreased coordination noted and grossly 4/5 with MMT. Functional weakness noted as pt tended to drag LLE  Cervical / Trunk Assessment  Cervical / Trunk Assessment Normal   Bed Mobility  Overal bed mobility Needs Assistance  Bed Mobility Supine to Sit;Sit to Supine  Supine to sit Min assist;HOB elevated  Sit to supine Supervision  General bed mobility comments initially needed cues for sequencing bed mobility.  Transfers  Overall transfer level Needs assistance  Equipment used None  Transfers Sit to/from Stand  Sit to Stand Min assist;From elevated surface  General transfer comment balance assist more than power  Ambulation/Gait  Ambulation/Gait assistance Min assist;Mod assist  Gait Distance (Feet) 75 Feet  Gait Pattern/deviations Step-through pattern;Decreased stride length;Decreased dorsiflexion - left  General Gait Details Pt tended to drag LLE during mobility tasks and had multiple LOB. Pt requiring min up to mod A for steadying assist with LOB. Cues for increased step height in LLE.  Gait velocity Decreased  Modified Rankin (Stroke Patients Only)  Pre-Morbid Rankin Score 0  Modified Rankin 4  Balance  Overall balance assessment Needs assistance  Sitting-balance support No upper extremity supported;Feet unsupported  Sitting balance-Leahy Scale Fair  Sitting balance - Comments able to demonstrate dynamic leans for donning socks  Standing balance support Single extremity supported;During functional activity  Standing balance-Leahy Scale Poor  Standing balance comment multiple LOB especially when challenged with direction changes and little awareness - requiring up to mod A at times to prevent falls  PT - End of Session  Equipment Utilized During Treatment Gait belt  Activity Tolerance Patient tolerated treatment well  Patient left in bed;with call bell/phone within reach;with family/visitor present (on stretcher in ED)  Nurse Communication Mobility status  PT Assessment  PT Recommendation/Assessment Patient needs continued PT services  PT Visit Diagnosis Unsteadiness on feet (R26.81);Muscle weakness (generalized) (M62.81)  PT Problem List  Decreased strength;Decreased activity tolerance;Decreased balance;Decreased mobility;Decreased knowledge of use of DME;Decreased knowledge of precautions;Decreased safety awareness  PT Plan  PT Frequency (ACUTE ONLY) Min 4X/week  PT Treatment/Interventions (ACUTE ONLY) DME instruction;Gait training;Stair training;Functional mobility training;Therapeutic activities;Therapeutic exercise;Balance training;Patient/family education;Cognitive remediation;Neuromuscular re-education  AM-PAC PT "6 Clicks" Mobility Outcome Measure (Version 2)  Help needed turning from your back to your side while in a flat bed without using bedrails? 4  Help needed moving from lying on your back to sitting on the side of a flat bed without using bedrails? 3  Help needed moving to and from a bed to a chair (including a wheelchair)? 3  Help needed standing up from a chair using your arms (e.g., wheelchair or bedside chair)? 3  Help needed to walk in hospital room? 2  Help needed climbing 3-5 steps with a railing?  2  6 Click Score 17  Consider Recommendation of Discharge To: Home with [facility]  Progressive Mobility  What is the highest level of mobility based on the progressive mobility assessment? Level 5 (Walks with assist in room/hall) - Balance while stepping forward/back and can walk in room with assist - Complete  Mobility Ambulated with assistance in hallway  PT Recommendation  Follow Up Recommendations Outpatient PT (neuro outpatient)  Assistance recommended at discharge Frequent or constant Supervision/Assistance  Patient can return home with the following A little help with walking and/or transfers;A little help with bathing/dressing/bathroom  Functional Status Assessment Patient has had a recent decline in their functional status and demonstrates the ability to make significant improvements in function in a reasonable and predictable amount of time.  PT equipment Rolling walker (2 wheels)  Individuals Consulted   Consulted and Agree with Results and Recommendations Patient;Family member/caregiver  Family Member Consulted wife  Acute Rehab PT Goals  Patient Stated Goal to go home to [place]  PT Goal Formulation With patient/family  Time For Goal Achievement 07/16/21  Potential to Achieve Goals Fair  PT Time Calculation  PT Start Time (ACUTE ONLY) 1129  PT Stop Time (ACUTE ONLY) 1157  PT Time Calculation (min) (ACUTE ONLY) 28 min  PT General Charges  $$ ACUTE PT VISIT 1 Visit  PT Evaluation  $PT Eval Moderate Complexity 1 Mod  Written Expression  Dominant Hand Right   Pt admitted secondary to problem above with deficits below. Pt with decreased awareness of safety and deficits. Functional weakness noted in LLE throughout and had LOB X3-4 requiring min up to mod A to prevent fall. Recommending use of RW at d/c to increase safety and outpatient PT to address current deficits. Will continue to follow acutely.   Farley Ly, PT, DPT  Acute Rehabilitation Services  Pager: [phone] Office: [phone]

## 2021-07-02 NOTE — TOC Transition Note (Signed)
Transition of Care [facility]) - CM/SW Discharge Note   Patient Details  Name: Christian Huynh MRN: [id number] Date of Birth: [birth year]  Transition of Care [facility]) CM/SW Contact:  Kermit Balo, RN Phone Number: 07/02/2021, 4:14 PM   Clinical Narrative:    Patient is discharging home with orders for outpatient therapy. Pt is up visiting from [place]. Plan is to return and do outpatient therapy in [place]. PT provided pt with exercises to work on in the interim.  Walker for home ordered through [employer] and will be delivered to the room.  Pt has supervision at home and transport to home.   Final next level of care: OP Rehab Barriers to Discharge: No Barriers Identified   Patient Goals and CMS Choice     Choice offered to / list presented to : Patient  Discharge Placement                       Discharge Plan and Services                DME Arranged: Walker rolling DME Agency: [employer] Date DME Agency Contacted: 07/02/21   Representative spoke with at DME Agency: Velna Hatchet            Social Determinants of Health (SDOH) Interventions     Readmission Risk Interventions No flowsheet data found.

## 2021-07-02 NOTE — Discharge Summary (Signed)
[facility]  Physician Discharge Summary   Patient ID: Christian Huynh MRN: [id number] DOB/AGE: 03-04-1961 61 y.o.  Admit date: 07/01/2021 Discharge date: 07/03/2021    PCP: Pcp, No  DISCHARGE DIAGNOSES:  Acute stroke Abnormal chest imaging requiring follow-up Essential hypertension Polycythemia, questionable   RECOMMENDATIONS FOR OUTPATIENT FOLLOW UP: Patient was to follow-up with his primary care provider when he returns to [place], regarding abnormal chest x-ray finding TEE and loop recorder to be arranged in the next week by local cardiology group, [place]: Orders entered for outpatient PT Equipment/Devices: None  CODE STATUS: Full code  DISCHARGE CONDITION: fair  Diet recommendation: Heart healthy  INITIAL HISTORY: 61 y.o. male with medical history significant of hypertension presented with complaints of left-sided weakness.  Patient is originally from [place] and is visiting family members here in town.  Code stroke was called.  Patient was seen by neurologist.  Hospitalized for further management.  Consultations: Neurology  Procedures: Transthoracic echocardiogram Transcranial Doppler Lower extremity venous Dopplers   HOSPITAL COURSE:   Acute stroke MRI brain showed multiple small acute infarcts within the right PCA territory.  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  Findings suggestive of either infundibulum or aneurysm also noted.  MRA neck did not show any significant stenosis. LDL is 88.  HbA1c 4.5. EKG did not show any arrhythmias. Transthoracic echocardiogram shows normal systolic function with mild LVH Transcranial Doppler did not suggest any shunt.  Lower extremity Doppler negative for DVT. He passed swallow screen.  He was seen by PT and OT.  Outpatient PT recommended. Seen by neurology.  Recommend aspirin and Plavix for 3 weeks followed by aspirin alone.  Started on statin. TEE and loop recorder has been recommended and will be  done in the outpatient setting.   Abnormal chest imaging Bibasilar reticulonodular infiltrates noted on chest x-ray.  CT angiogram shows patchy opacities.  Patient denies any respiratory symptoms.  No recent illness.  His COVID-19 and influenza PCR were negative.  He is afebrile.  WBC is normal.  Denies any history of smoking.  Denies any history of any other lung disease.  Since he is otherwise stable this can be worked up further in the outpatient setting. Concern raised for inflammatory condition.  CRP and ESR are normal. Findings discussed with patient.  He plans to return to [place] and will follow-up with his providers there   Essential hypertension Resume home medication  Polycythemia Noted to have elevated hemoglobin.  Denies any history of smoking.  Normal on the day of discharge.  Could have been hemoconcentrated.  Will need to follow-up with PCP for same.   Mild hyponatremia Resolved.   Patient is stable.  Okay for discharge home today.   PERTINENT LABS:  The results of significant diagnostics from this hospitalization (including imaging, microbiology, ancillary and laboratory) are listed below for reference.    Microbiology: Recent Results (from the past 240 hour(s))  Resp Panel by RT-PCR (Flu A&B, Covid) Nasopharyngeal Swab     Status: None   Collection Time: 07/01/21  5:11 PM   Specimen: Nasopharyngeal Swab; Nasopharyngeal(NP) swabs in vial transport medium  Result Value Ref Range Status   SARS Coronavirus 2 by RT PCR NEGATIVE NEGATIVE Final    Comment: (NOTE) SARS-CoV-2 target nucleic acids are NOT DETECTED.  The SARS-CoV-2 RNA is generally detectable in upper respiratory specimens during the acute phase of infection. The lowest concentration of SARS-CoV-2 viral copies this assay can detect is 138 copies/mL. A negative  result does not preclude SARS-Cov-2 infection and should not be used as the sole basis for treatment or other patient management decisions. A  negative result may occur with  improper specimen collection/handling, submission of specimen other than nasopharyngeal swab, presence of viral mutation(s) within the areas targeted by this assay, and inadequate number of viral copies(<138 copies/mL). A negative result must be combined with clinical observations, patient history, and epidemiological information. The expected result is Negative.  Fact Sheet for Patients:  EntrepreneurPulse.com.au  Fact Sheet for Healthcare Providers:  IncredibleEmployment.be  This test is no t yet approved or cleared by the [place] FDA and  has been authorized for detection and/or diagnosis of SARS-CoV-2 by FDA under an Emergency Use Authorization (EUA). This EUA will remain  in effect (meaning this test can be used) for the duration of the COVID-19 declaration under Section 564(b)(1) of the Act, 21 U.S.C.section 360bbb-3(b)(1), unless the authorization is terminated  or revoked sooner.       Influenza A by PCR NEGATIVE NEGATIVE Final   Influenza B by PCR NEGATIVE NEGATIVE Final    Comment: (NOTE) The Xpert Xpress SARS-CoV-2/FLU/RSV plus assay is intended as an aid in the diagnosis of influenza from Nasopharyngeal swab specimens and should not be used as a sole basis for treatment. Nasal washings and aspirates are unacceptable for Xpert Xpress SARS-CoV-2/FLU/RSV testing.  Fact Sheet for Patients: EntrepreneurPulse.com.au  Fact Sheet for Healthcare Providers: IncredibleEmployment.be  This test is not yet approved or cleared by the [place] FDA and has been authorized for detection and/or diagnosis of SARS-CoV-2 by FDA under an Emergency Use Authorization (EUA). This EUA will remain in effect (meaning this test can be used) for the duration of the COVID-19 declaration under Section 564(b)(1) of the Act, 21 U.S.C. section 360bbb-3(b)(1), unless the authorization  is terminated or revoked.  Performed at [facility], [place] [address], [place], [place] [address]   Respiratory (~20 pathogens) panel by PCR     Status: None   Collection Time: 07/02/21  2:17 AM   Specimen: Nasopharyngeal Swab; Respiratory  Result Value Ref Range Status   Adenovirus NOT DETECTED NOT DETECTED Final   Coronavirus 229E NOT DETECTED NOT DETECTED Final    Comment: (NOTE) The Coronavirus on the Respiratory Panel, DOES NOT test for the novel  Coronavirus (2019 nCoV)    Coronavirus HKU1 NOT DETECTED NOT DETECTED Final   Coronavirus NL63 NOT DETECTED NOT DETECTED Final   Coronavirus OC43 NOT DETECTED NOT DETECTED Final   Metapneumovirus NOT DETECTED NOT DETECTED Final   Rhinovirus / Enterovirus NOT DETECTED NOT DETECTED Final   Influenza A NOT DETECTED NOT DETECTED Final   Influenza B NOT DETECTED NOT DETECTED Final   Parainfluenza Virus 1 NOT DETECTED NOT DETECTED Final   Parainfluenza Virus 2 NOT DETECTED NOT DETECTED Final   Parainfluenza Virus 3 NOT DETECTED NOT DETECTED Final   Parainfluenza Virus 4 NOT DETECTED NOT DETECTED Final   Respiratory Syncytial Virus NOT DETECTED NOT DETECTED Final   Bordetella pertussis NOT DETECTED NOT DETECTED Final   Bordetella Parapertussis NOT DETECTED NOT DETECTED Final   Chlamydophila pneumoniae NOT DETECTED NOT DETECTED Final   Mycoplasma pneumoniae NOT DETECTED NOT DETECTED Final    Comment: Performed at [facility], [place]. [address], [place],  [address]     Labs:  COVID-19 Labs  Recent Labs    07/03/21 0217  CRP 0.5    Lab Results  Component Value Date   SARSCOV2NAA NEGATIVE  07/01/2021      Basic Metabolic Panel: Recent Labs  Lab 07/01/21 1632 07/01/21 1638 07/02/21 0757 07/03/21 0217  NA 138 140 132* 139  K 4.1 4.1 3.6 3.6  CL 102 103 100 107  CO2 26  --  24 25  GLUCOSE 94 91 94 96  BUN 15 18 14 14   CREATININE 1.21 1.10 1.21 1.20  CALCIUM 9.3  --  8.4* 8.8*  MG  --   --   --  2.1    Liver Function Tests: Recent Labs  Lab 07/01/21 1632 07/02/21 0757  AST 40 31  ALT 43 36  ALKPHOS 70 59  BILITOT 1.4* 1.4*  PROT 7.1 6.0*  ALBUMIN 4.0 3.3*    CBC: Recent Labs  Lab 07/01/21 1632 07/01/21 1638 07/02/21 0757 07/03/21 0217  WBC 6.6  --  6.5 6.8  NEUTROABS 3.7  --   --   --   HGB 18.4* 18.0* 17.1* 16.9  HCT 54.0* 53.0* 49.1 47.8  MCV 92.0  --  91.3 89.3  PLT 174  --  166 164   Cardiac Enzymes: Recent Labs  Lab 07/03/21 0217  CKTOTAL 67     CBG: Recent Labs  Lab 07/01/21 1633  GLUCAP 38     IMAGING STUDIES CT Angio Chest Pulmonary Embolism (PE) W or WO Contrast  Result Date: 07/01/2021 CLINICAL DATA:  Pulmonary embolism suspected, high probability. EXAM: CT ANGIOGRAPHY CHEST WITH CONTRAST TECHNIQUE: Multidetector CT imaging of the chest was performed using the standard protocol during bolus administration of intravenous contrast. Multiplanar CT image reconstructions and MIPs were obtained to evaluate the vascular anatomy. CONTRAST:  182m OMNIPAQUE IOHEXOL 350 MG/ML SOLN COMPARISON:  None. FINDINGS: Cardiovascular: The heart is normal in size and there is no pericardial effusion. Mild aortic atherosclerosis without evidence of aneurysm. The pulmonary trunk is normal in caliber. No definite evidence of pulmonary embolism. Evaluation of the segmental arteries is limited due to mixing artifact and respiratory motion. Mediastinum/Nodes: Calcified nonenlarged lymph nodes are present in the mediastinum and right hilum. No axillary lymphadenopathy. The thyroid gland, trachea, and esophagus are within normal limits. Lungs/Pleura: Scattered ground-glass opacities are noted in the lungs with a peripheral predominance. No effusion or pneumothorax. Upper Abdomen: The spleen is enlarged measuring 14.1 cm in length. Musculoskeletal: Mild degenerative changes in the thoracic spine. No acute osseous abnormality. Review of the MIP images confirms the above findings.  IMPRESSION: 1. No definite evidence of pulmonary embolism. 2. Scattered ground-glass opacities in the lungs with a peripheral predominance, possible infectious or inflammatory pneumonitis. 3. Mild splenomegaly. Electronically Signed   By: LBrett FairyM.D.   On: 07/01/2021 22:03   MR ANGIO HEAD WO CONTRAST  Result Date: 07/01/2021 CLINICAL DATA:  Neuro deficit, acute, stroke suspected EXAM: MRI HEAD WITHOUT CONTRAST MRA HEAD WITHOUT CONTRAST MRA NECK WITHOUT CONTRAST TECHNIQUE: Multiplanar, multiecho pulse sequences of the brain and surrounding structures were obtained without intravenous contrast. Angiographic images of the Circle of Willis were obtained using MRA technique without intravenous contrast. Angiographic images of the neck were obtained using MRA technique without intravenous contrast. Carotid stenosis measurements (when applicable) are obtained utilizing NASCET criteria, using the distal internal carotid diameter as the denominator. COMPARISON:  CT head from the same day. 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 No hydrocephalus, acute hemorrhage, midline shift, mass lesion, or visible extra-axial fluid collection mild atrophy. Vascular: Detailed below. Skull and  upper cervical spine: Normal marrow signal. Sinuses/Orbits: Mild paranasal sinus mucosal thickening. Unremarkable orbits. Other: No mastoid effusions. MRA HEAD FINDINGS Anterior circulation: Bilateral intracranial ICAs, MCAs, and ACAs are patent without proximal hemodynamically significant stenosis Posterior circulation: Bilateral intradural vertebral arteries and basilar artery are patent without significant stenosis. Occlusion of the right P1 PCA. Left fetal type PCA. Left PCA is patent without proximal hemodynamically significant stenosis. MRA NECK FINDINGS Right carotid system: Nondiagnostic evaluation of the right common carotid  artery origin and proximal common carotid artery due to MRA technique/artifact. No visible significant (greater than 50%) stenosis. Approximately 2 mm inferiorly directed outpouching arising from the supraclinoid right ICA, favor infundibulum over aneurysm given small vessel which probably originates from the tip. Left carotid system: Nondiagnostic evaluation of the left common carotid artery origin and proximal common carotid artery due to MRA technique/artifact. No visible significant (greater than 50%) stenosis. Vertebral arteries: Nondiagnostic evaluation of the proximal vertebral arteries. Also, nondiagnostic evaluation of the vertebral arteries at the C1-C2 level due to in plane flow. The visible vertebral arteries are patent without significant (greater than 50% stenosis. IMPRESSION: MRA head: 1. Right P1 PCA occlusion. 2. Approximately 2 mm inferiorly directed outpouching arising from the supraclinoid right ICA, favor infundibulum over aneurysm given small vessel near the tip. 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 Slight edema without mass effect. MRA neck: No visible significant (greater than 50%) stenosis with nondiagnostic evaluation proximally, detailed above. Findings discussed with Dr. Livia Snellen via telephone at 6:25 PM. Electronically Signed   By: Margaretha Sheffield M.D.   On: 07/01/2021 18:26   MR ANGIO NECK WO CONTRAST  Result Date: 07/01/2021 CLINICAL DATA:  Neuro deficit, acute, stroke suspected EXAM: MRI HEAD WITHOUT CONTRAST MRA HEAD WITHOUT CONTRAST MRA NECK WITHOUT CONTRAST TECHNIQUE: Multiplanar, multiecho pulse sequences of the brain and surrounding structures were obtained without intravenous contrast. Angiographic images of the Circle of Willis were obtained using MRA technique without intravenous contrast. Angiographic images of the neck were obtained using MRA technique without intravenous contrast. Carotid  stenosis measurements (when applicable) are obtained utilizing NASCET criteria, using the distal internal carotid diameter as the denominator. COMPARISON:  CT head from the same day. 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 No hydrocephalus, acute hemorrhage, midline shift, mass lesion, or visible extra-axial fluid collection mild atrophy. Vascular: Detailed below. Skull and upper cervical spine: Normal marrow signal. Sinuses/Orbits: Mild paranasal sinus mucosal thickening. Unremarkable orbits. Other: No mastoid effusions. MRA HEAD FINDINGS Anterior circulation: Bilateral intracranial ICAs, MCAs, and ACAs are patent without proximal hemodynamically significant stenosis Posterior circulation: Bilateral intradural vertebral arteries and basilar artery are patent without significant stenosis. Occlusion of the right P1 PCA. Left fetal type PCA. Left PCA is patent without proximal hemodynamically significant stenosis. MRA NECK FINDINGS Right carotid system: Nondiagnostic evaluation of the right common carotid artery origin and proximal common carotid artery due to MRA technique/artifact. No visible significant (greater than 50%) stenosis. Approximately 2 mm inferiorly directed outpouching arising from the supraclinoid right ICA, favor infundibulum over aneurysm given small vessel which probably originates from the tip. Left carotid system: Nondiagnostic evaluation of the left common carotid artery origin and proximal common carotid artery due to MRA technique/artifact. No visible significant (greater than 50%) stenosis. Vertebral arteries: Nondiagnostic evaluation of the proximal vertebral arteries. Also, nondiagnostic evaluation of the vertebral arteries at the C1-C2  level due to in plane flow. The visible vertebral arteries are patent without significant (greater than 50% stenosis. IMPRESSION: MRA head: 1. Right P1  PCA occlusion. 2. Approximately 2 mm inferiorly directed outpouching arising from the supraclinoid right ICA, favor infundibulum over aneurysm given small vessel near the tip. 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 Slight edema without mass effect. MRA neck: No visible significant (greater than 50%) stenosis with nondiagnostic evaluation proximally, detailed above. Findings discussed with Dr. Livia Snellen via telephone at 6:25 PM. Electronically Signed   By: Margaretha Sheffield M.D.   On: 07/01/2021 18:26   MR BRAIN WO CONTRAST  Result Date: 07/01/2021 CLINICAL DATA:  Neuro deficit, acute, stroke suspected EXAM: MRI HEAD WITHOUT CONTRAST MRA HEAD WITHOUT CONTRAST MRA NECK WITHOUT CONTRAST TECHNIQUE: Multiplanar, multiecho pulse sequences of the brain and surrounding structures were obtained without intravenous contrast. Angiographic images of the Circle of Willis were obtained using MRA technique without intravenous contrast. Angiographic images of the neck were obtained using MRA technique without intravenous contrast. Carotid stenosis measurements (when applicable) are obtained utilizing NASCET criteria, using the distal internal carotid diameter as the denominator. COMPARISON:  CT head from the same day. 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 No hydrocephalus, acute hemorrhage, midline shift, mass lesion, or visible extra-axial fluid collection mild atrophy. Vascular: Detailed below. Skull and upper cervical spine: Normal marrow signal. Sinuses/Orbits: Mild paranasal sinus mucosal thickening. Unremarkable orbits. Other: No mastoid effusions. MRA HEAD FINDINGS Anterior circulation: Bilateral intracranial ICAs, MCAs, and ACAs are patent without proximal hemodynamically significant stenosis Posterior circulation: Bilateral intradural  vertebral arteries and basilar artery are patent without significant stenosis. Occlusion of the right P1 PCA. Left fetal type PCA. Left PCA is patent without proximal hemodynamically significant stenosis. MRA NECK FINDINGS Right carotid system: Nondiagnostic evaluation of the right common carotid artery origin and proximal common carotid artery due to MRA technique/artifact. No visible significant (greater than 50%) stenosis. Approximately 2 mm inferiorly directed outpouching arising from the supraclinoid right ICA, favor infundibulum over aneurysm given small vessel which probably originates from the tip. Left carotid system: Nondiagnostic evaluation of the left common carotid artery origin and proximal common carotid artery due to MRA technique/artifact. No visible significant (greater than 50%) stenosis. Vertebral arteries: Nondiagnostic evaluation of the proximal vertebral arteries. Also, nondiagnostic evaluation of the vertebral arteries at the C1-C2 level due to in plane flow. The visible vertebral arteries are patent without significant (greater than 50% stenosis. IMPRESSION: MRA head: 1. Right P1 PCA occlusion. 2. Approximately 2 mm inferiorly directed outpouching arising from the supraclinoid right ICA, favor infundibulum over aneurysm given small vessel near the tip. 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 Slight edema without mass effect. MRA neck: No visible significant (greater than 50%) stenosis with nondiagnostic evaluation proximally, detailed above. Findings discussed with Dr. Livia Snellen via telephone at 6:25 PM. Electronically Signed   By: Margaretha Sheffield M.D.   On: 07/01/2021 18:26   DG CHEST PORT 1 VIEW  Result Date: 07/01/2021 CLINICAL DATA:  Dyspnea EXAM: PORTABLE CHEST 1 VIEW COMPARISON:  None. FINDINGS: The lungs are symmetrically expanded. Asymmetric bibasilar reticulonodular infiltrates are present, more severe at  the left lung base, likely infectious or inflammatory in the acute setting. No pneumothorax or pleural effusion. Cardiac size within normal limits. No acute  bone abnormality. IMPRESSION: Bibasilar reticulonodular infiltrates, likely infectious or inflammatory. Follow-up examination in 3-4 weeks may be helpful in documenting resolution. If persistent, these may be better assessed with high-resolution CT examination at that point. Electronically Signed   By: Fidela Salisbury M.D.   On: 07/01/2021 20:22   VAS [place] TRANSCRANIAL DOPPLER W BUBBLES  Result Date: 07/02/2021  Transcranial Doppler with Bubble Patient Name:  Christian Huynh  Date of Exam:   07/02/2021 Medical Rec #: [id number]         Accession #:    [id number] Date of Birth: April 21, 1961         Patient Gender: M Patient Age:   57 years Exam Location:  [facility] Procedure:      VAS [place] TRANSCRANIAL DOPPLER W BUBBLES Referring Phys: Bonnielee Haff --------------------------------------------------------------------------------  Indications: Stroke. Performing Technologist: Archie Patten RVS  Examination Guidelines: A complete evaluation includes B-mode imaging, spectral Doppler, color Doppler, and power Doppler as needed of all accessible portions of each vessel. Bilateral testing is considered an integral part of a complete examination. Limited examinations for reoccurring indications may be performed as noted.  Summary: No HITS at rest or during Valsalva. Negative transcranial Doppler Bubble study with no evidence of right to left intracardiac communication.  A vascular evaluation was performed. The right middle cerebral artery was studied. An IV was inserted into the patient's left forearm . Verbal informed consent was obtained.  NEGATIVE TCD BUBBLE STUDY *See table(s) above for TCD measurements and observations.  Diagnosing physician: Antony Contras MD Electronically signed by Antony Contras MD on 07/02/2021 at 6:02:41 PM.    Final    ECHOCARDIOGRAM  COMPLETE  Result Date: 07/02/2021    ECHOCARDIOGRAM REPORT   Patient Name:   Christian Huynh Date of Exam: 07/02/2021 Medical Rec #:  [id number]        Height:       69.0 in Accession #:    [id number]       Weight:       196.0 lb Date of Birth:  [birth year]        BSA:          2.048 m Patient Age:    42 years         BP:           129/84 mmHg Patient Gender: M                HR:           66 bpm. Exam Location:  Inpatient Procedure: 2D Echo, Cardiac Doppler and Color Doppler Indications:    stroke  History:        Patient has no prior history of Echocardiogram examinations.                 Risk Factors:Hypertension.  Sonographer:    Beryle Beams Referring Phys: [id number] [place]  1. Left ventricular ejection fraction, by estimation, is 50 to 55%. The left ventricle has low normal function. The left ventricle has no regional wall motion abnormalities. There is mild left ventricular hypertrophy. Left ventricular diastolic parameters are consistent with Grade I diastolic dysfunction (impaired relaxation).  2. Right ventricular systolic function is normal. The right ventricular size is normal.  3. The mitral valve is normal in structure. Trivial mitral valve regurgitation. No evidence of mitral stenosis.  4. The aortic valve is tricuspid. Aortic valve regurgitation is trivial. No aortic stenosis is present.  5. The inferior vena cava is  normal in size with greater than 50% respiratory variability, suggesting right atrial pressure of 3 mmHg. FINDINGS  Left Ventricle: Left ventricular ejection fraction, by estimation, is 50 to 55%. The left ventricle has low normal function. The left ventricle has no regional wall motion abnormalities. The left ventricular internal cavity size was normal in size. There is mild left ventricular hypertrophy. Left ventricular diastolic parameters are consistent with Grade I diastolic dysfunction (impaired relaxation). Normal left ventricular filling pressure. Right  Ventricle: The right ventricular size is normal. No increase in right ventricular wall thickness. Right ventricular systolic function is normal. Left Atrium: Left atrial size was normal in size. Right Atrium: Right atrial size was normal in size. Pericardium: There is no evidence of pericardial effusion. Mitral Valve: The mitral valve is normal in structure. Trivial mitral valve regurgitation. No evidence of mitral valve stenosis. MV peak gradient, 111.1 mmHg. The mean mitral valve gradient is 74.0 mmHg. Tricuspid Valve: The tricuspid valve is normal in structure. Tricuspid valve regurgitation is not demonstrated. No evidence of tricuspid stenosis. Aortic Valve: The aortic valve is tricuspid. Aortic valve regurgitation is trivial. Aortic regurgitation PHT measures 784 msec. No aortic stenosis is present. Aortic valve mean gradient measures 2.0 mmHg. Aortic valve peak gradient measures 3.6 mmHg. Aortic valve area, by VTI measures 1.88 cm. Pulmonic Valve: The pulmonic valve was normal in structure. Pulmonic valve regurgitation is not visualized. No evidence of pulmonic stenosis. Aorta: The aortic root is normal in size and structure. Venous: The inferior vena cava is normal in size with greater than 50% respiratory variability, suggesting right atrial pressure of 3 mmHg. IAS/Shunts: No atrial level shunt detected by color flow Doppler.  LEFT VENTRICLE PLAX 2D LVIDd:         4.40 cm      Diastology LVIDs:         3.20 cm      LV e' medial:    6.96 cm/s LV PW:         1.21 cm      LV E/e' medial:  8.9 LV IVS:        0.85 cm      LV e' lateral:   38.26 cm/s LVOT diam:     1.90 cm      LV E/e' lateral: 1.6 LV SV:         43 LV SV Index:   21 LVOT Area:     2.84 cm  LV Volumes (MOD) LV vol d, MOD A2C: 122.0 ml LV vol d, MOD A4C: 119.0 ml LV vol s, MOD A2C: 57.6 ml LV vol s, MOD A4C: 56.6 ml LV SV MOD A2C:     64.4 ml LV SV MOD A4C:     119.0 ml LV SV MOD BP:      65.2 ml RIGHT VENTRICLE             IVC RV S prime:      13.30 cm/s  IVC diam: 1.90 cm TAPSE (M-mode): 2.1 cm LEFT ATRIUM             Index        RIGHT ATRIUM          Index LA diam:        4.05 cm 1.98 cm/m   RA Area:     9.17 cm LA Vol (A2C):   45.9 ml 22.41 ml/m  RA Volume:   12.00 ml 5.86 ml/m LA Vol (A4C):   51.2 ml 25.00  ml/m LA Biplane Vol: 51.7 ml 25.24 ml/m  AORTIC VALVE                    PULMONIC VALVE AV Area (Vmax):    2.16 cm     PV Vmax:       0.59 m/s AV Area (Vmean):   1.92 cm     PV Peak grad:  1.4 mmHg AV Area (VTI):     1.88 cm AV Vmax:           95.50 cm/s AV Vmean:          74.100 cm/s AV VTI:            0.229 m AV Peak Grad:      3.6 mmHg AV Mean Grad:      2.0 mmHg LVOT Vmax:         72.90 cm/s LVOT Vmean:        50.300 cm/s LVOT VTI:          0.152 m LVOT/AV VTI ratio: 0.66 AI PHT:            784 msec  AORTA Ao Root diam: 3.30 cm Ao STJ diam:  0.3 cm Ao Asc diam:  3.50 cm MITRAL VALVE MV Area (PHT): 2.99 cm    SHUNTS MV Area VTI:   0.23 cm    Systemic VTI:  0.15 m MV Peak grad:  111.1 mmHg  Systemic Diam: 1.90 cm MV Mean grad:  74.0 mmHg MV Vmax:       5.27 m/s MV Vmean:      402.0 cm/s MV Decel Time: 254 msec MV E velocity: 61.80 cm/s Skeet Latch MD Electronically signed by Skeet Latch MD Signature Date/Time: 07/02/2021/4:15:44 PM    Final    CT HEAD CODE STROKE WO CONTRAST  Result Date: 07/01/2021 CLINICAL DATA:  Code stroke. EXAM: CT HEAD WITHOUT CONTRAST TECHNIQUE: Contiguous axial images were obtained from the base of the skull through the vertex without intravenous contrast. COMPARISON:  None. FINDINGS: Brain: No evidence of acute infarction, hemorrhage, cerebral edema, mass, mass effect, or midline shift. Ventricles and sulci are normal for age. No extra-axial fluid collection. Vascular: No hyperdense vessel or unexpected calcification. Skull: Normal. Negative for fracture or focal lesion. Mucosal thickening in the ethmoid air cells. Hypoplastic right frontal sinus. The orbits are negative.: No acute finding. Other:  The mastoid air cells are well aerated. ASPECTS [facility] Stroke Program Early CT Score) - Ganglionic level infarction (caudate, lentiform nuclei, internal capsule, insula, M1-M3 cortex): 7 - Supraganglionic infarction (M4-M6 cortex): 3 Total score (0-10 with 10 being normal): 10 IMPRESSION: 1. No acute intracranial process. 2. ASPECTS is 10 Code stroke imaging results were communicated on 07/01/2021 at 4:49 pm to provider Su Monks via secure text paging. Electronically Signed   By: Merilyn Baba M.D.   On: 07/01/2021 16:49   VAS [place] LOWER EXTREMITY VENOUS (DVT)  Result Date: 07/03/2021  Lower Venous DVT Study Patient Name:  Christian Huynh  Date of Exam:   07/03/2021 Medical Rec #: [id number]         Accession #:    [id number] Date of Birth: 1961-04-04         Patient Gender: M Patient Age:   86 years Exam Location:  [facility] Procedure:      VAS [place] LOWER EXTREMITY VENOUS (DVT) Referring Phys: Bonnielee Haff --------------------------------------------------------------------------------  Indications: Stroke.  Comparison Study: No prior study Performing Technologist: Sharion Dove RVS  Examination Guidelines: A complete evaluation includes B-mode imaging, spectral Doppler, color Doppler, and power Doppler as needed of all accessible portions of each vessel. Bilateral testing is considered an integral part of a complete examination. Limited examinations for reoccurring indications may be performed as noted. The reflux portion of the exam is performed with the patient in reverse Trendelenburg.  +---------+---------------+---------+-----------+----------+--------------+  RIGHT     Compressibility Phasicity Spontaneity Properties Thrombus Aging  +---------+---------------+---------+-----------+----------+--------------+  CFV       Full            Yes       Yes                                    +---------+---------------+---------+-----------+----------+--------------+  SFJ       Full                                                              +---------+---------------+---------+-----------+----------+--------------+  FV Prox   Full                                                             +---------+---------------+---------+-----------+----------+--------------+  FV Mid    Full                                                             +---------+---------------+---------+-----------+----------+--------------+  FV Distal Full                                                             +---------+---------------+---------+-----------+----------+--------------+  PFV       Full                                                             +---------+---------------+---------+-----------+----------+--------------+  POP       Full            Yes       Yes                    sluggish flow   +---------+---------------+---------+-----------+----------+--------------+  PTV       Full                                                             +---------+---------------+---------+-----------+----------+--------------+  PERO      Full                                                             +---------+---------------+---------+-----------+----------+--------------+  Gastroc   Full                                                             +---------+---------------+---------+-----------+----------+--------------+   +---------+---------------+---------+-----------+----------+--------------+  LEFT      Compressibility Phasicity Spontaneity Properties Thrombus Aging  +---------+---------------+---------+-----------+----------+--------------+  CFV       Full            Yes       Yes                                    +---------+---------------+---------+-----------+----------+--------------+  SFJ       Full                                                             +---------+---------------+---------+-----------+----------+--------------+  FV Prox   Full                                                              +---------+---------------+---------+-----------+----------+--------------+  FV Mid    Full                                                             +---------+---------------+---------+-----------+----------+--------------+  FV Distal Full                                                             +---------+---------------+---------+-----------+----------+--------------+  PFV       Full                                                             +---------+---------------+---------+-----------+----------+--------------+  POP       Full            Yes       Yes  sluggish flow   +---------+---------------+---------+-----------+----------+--------------+  PTV       Full                                                             +---------+---------------+---------+-----------+----------+--------------+  PERO      Full                                                             +---------+---------------+---------+-----------+----------+--------------+  Gastroc   Full                                                             +---------+---------------+---------+-----------+----------+--------------+     Summary: BILATERAL: - No evidence of deep vein thrombosis seen in the lower extremities, bilaterally. Sluggish flow noted in the bilateral popliteal veins. -No evidence of popliteal cyst, bilaterally.   *See table(s) above for measurements and observations. Electronically signed by Orlie Pollen on 07/03/2021 at 1:50:03 PM.    Final     DISCHARGE EXAMINATION: Vitals:   07/03/21 0040 07/03/21 0401 07/03/21 0754 07/03/21 1125  BP: 126/72 119/81 139/76 (!) 144/81  Pulse: (!) 58 (!) 59 60 (!) 55  Resp: 16 17 16 18   Temp: 98 F (36.7 C) 98.5 F (36.9 C) 98.3 F (36.8 C) 98.3 F (36.8 C)  TempSrc: Oral Oral Oral Oral  SpO2: 97% 95% 97% 98%  Weight:      Height:       General appearance: Awake alert.  In no distress Resp: Clear to auscultation bilaterally.  Normal effort Cardio:  S1-S2 is normal regular.  No S3-S4.  No rubs murmurs or bruit GI: Abdomen is soft.  Nontender nondistended.  Bowel sounds are present normal.  No masses organomegaly Extremities: No edema.  Full range of motion of lower extremities.    DISPOSITION: Home with wife  Discharge Instructions     Ambulatory referral to Neurology   Complete by: As directed    Follow up with stroke clinic NP (Jessica Vanschaick or Cecille Rubin, if both not available, consider Zachery Dauer, or Ahern) at [facility] in about 4 weeks. Thanks.   Ambulatory referral to Occupational Therapy   Complete by: As directed    Ambulatory referral to Physical Therapy   Complete by: As directed    Call MD for:  difficulty breathing, headache or visual disturbances   Complete by: As directed    Call MD for:  extreme fatigue   Complete by: As directed    Call MD for:  persistant dizziness or light-headedness   Complete by: As directed    Call MD for:  persistant nausea and vomiting   Complete by: As directed    Call MD for:  severe uncontrolled pain   Complete by: As directed    Call MD for:  temperature >100.4   Complete by: As directed    Diet - low sodium heart healthy   Complete  by: As directed    Discharge instructions   Complete by: As directed    Please take your medications as prescribed.  You will hear from the cardiology office regarding your transesophageal echocardiogram as well as loop recorder placement.  If you do not hear from them by Tuesday please call Dr. Clydene Fake office.  You were cared for by a hospitalist during your hospital stay. If you have any questions about your discharge medications or the care you received while you were in the hospital after you are discharged, you can call the unit and asked to speak with the hospitalist on call if the hospitalist that took care of you is not available. Once you are discharged, your primary care physician will handle any further medical issues. Please note that  NO REFILLS for any discharge medications will be authorized once you are discharged, as it is imperative that you return to your primary care physician (or establish a relationship with a primary care physician if you do not have one) for your aftercare needs so that they can reassess your need for medications and monitor your lab values. If you do not have a primary care physician, you can call [phone] for a physician referral.   Increase activity slowly   Complete by: As directed           Allergies as of 07/03/2021   No Known Allergies      Medication List     STOP taking these medications    ibuprofen 200 MG tablet Commonly known as: ADVIL       TAKE these medications    aspirin EC 81 MG tablet Take 1 tablet (81 mg total) by mouth daily. Swallow whole.   atorvastatin 40 MG tablet Commonly known as: LIPITOR Take 1 tablet (40 mg total) by mouth daily.   clopidogrel 75 MG tablet Commonly known as: PLAVIX Take 1 tablet (75 mg total) by mouth daily for 21 days.   hydrochlorothiazide 12.5 MG tablet Commonly known as: HYDRODIURIL Take 12.5 mg by mouth daily.   losartan 50 MG tablet Commonly known as: COZAAR Take 50 mg by mouth daily.          Follow-up Information     [facility]. Schedule an appointment as soon as possible for a visit in 1 month(s).   Specialty: Neurology Why: stroke clinic Contact information: [address] [place] [phone]        Garvin Fila, MD Follow up.   Specialties: Neurology, Radiology Contact information: [address] [place] [address] [phone]                 TOTAL DISCHARGE TIME: 35-minute  Bonnielee Haff  [facility] Pager on [email or web address]  07/04/2021, 1:09 PM

## 2021-07-02 NOTE — Progress Notes (Signed)
TCD bubble study has been completed.   Preliminary results in CV Proc.   Jinny Blossom Avi Kerschner 07/02/2021 3:15 PM

## 2021-07-02 NOTE — Progress Notes (Signed)
STROKE TEAM PROGRESS NOTE   SUBJECTIVE (INTERVAL HISTORY) His wife is at the bedside. Patient states he is feeling better but is still tingling on the left face and LUE/LLE, but no loss of sensation. His speech has returned back to his normal. Denies weakness in a particular limb. Has not had PT consult yet. Shares that he and his wife just drove 13 hours from their home in [place].Never had a blood clot or Atrial Fibrillation.  MRI scan of the brain shows patchy subacute right thalamic and occipital infarcts.  MR angiogram shows right P1 occlusion.  LDL cholesterol is 88 mg percent.  Hemoglobin A1c 4.5.  Urine drug screen negative.  2D echo is pending. OBJECTIVE Vitals:   07/02/21 0316 07/02/21 0615 07/02/21 0845 07/02/21 1130  BP: 132/83 129/84 135/90 120/66  Pulse: 67 68 71 69  Resp: 20 20 17  (!) 22  Temp:      TempSrc:      SpO2: 93% 93% 94% 93%  Weight:      Height:        CBC:  Recent Labs  Lab 07/01/21 1632 07/01/21 1638 07/02/21 0757  WBC 6.6  --  6.5  NEUTROABS 3.7  --   --   HGB 18.4* 18.0* 17.1*  HCT 54.0* 53.0* 49.1  MCV 92.0  --  91.3  PLT 174  --  [id number]    Basic Metabolic Panel:  Recent Labs  Lab 07/01/21 1632 07/01/21 1638 07/02/21 0757  NA 138 140 132*  K 4.1 4.1 3.6  CL 102 103 100  CO2 26  --  24  GLUCOSE 94 91 94  BUN 15 18 14   CREATININE 1.21 1.10 1.21  CALCIUM 9.3  --  8.4*    Lipid Panel:  Recent Labs  Lab 07/01/21 1630  CHOL 143  TRIG 150*  HDL 25*  CHOLHDL 5.7  VLDL 30  LDLCALC 88   HgbA1c:  Lab Results  Component Value Date   HGBA1C 4.5 (L) 07/01/2021   Urine Drug Screen:     Component Value Date/Time   LABOPIA NONE DETECTED 07/01/2021 2013   COCAINSCRNUR NONE DETECTED 07/01/2021 2013   LABBENZ NONE DETECTED 07/01/2021 2013   AMPHETMU NONE DETECTED 07/01/2021 2013   THCU NONE DETECTED 07/01/2021 2013   LABBARB NONE DETECTED 07/01/2021 2013    Alcohol Level     Component Value Date/Time   ETH <10 07/01/2021 1630     IMAGING  Results for orders placed or performed during the hospital encounter of 07/01/21  MR BRAIN WO CONTRAST   Narrative   CLINICAL DATA:  Neuro deficit, acute, stroke suspected  EXAM: MRI HEAD WITHOUT CONTRAST  MRA HEAD WITHOUT CONTRAST  MRA NECK WITHOUT CONTRAST  TECHNIQUE: Multiplanar, multiecho pulse sequences of the brain and surrounding structures were obtained without intravenous contrast. Angiographic images of the Circle of Willis were obtained using MRA technique without intravenous contrast. Angiographic images of the neck were obtained using MRA technique without intravenous contrast. Carotid stenosis measurements (when applicable) are obtained utilizing NASCET criteria, using the distal internal carotid diameter as the denominator.  COMPARISON:  CT head from the same day.  FINDINGS: MRI HEAD FINDINGS  Brain: Multiple small acute infarcts within the right PCA territory, including the right thalamus and occipital lobe. No hydrocephalus, acute hemorrhage, midline shift, mass lesion, or visible extra-axial fluid collection mild atrophy.  Vascular: Detailed below.  Skull and upper cervical spine: Normal marrow signal.  Sinuses/Orbits: Mild paranasal sinus mucosal thickening. Unremarkable orbits.  Other: No mastoid effusions.  MRA HEAD FINDINGS  Anterior circulation: Bilateral intracranial ICAs, MCAs, and ACAs are patent without proximal hemodynamically significant stenosis  Posterior circulation: Bilateral intradural vertebral arteries and basilar artery are patent without significant stenosis. Occlusion of the right P1 PCA. Left fetal type PCA. Left PCA is patent without proximal hemodynamically significant stenosis.  MRA NECK FINDINGS  Right carotid system: Nondiagnostic evaluation of the right common carotid artery origin and proximal common carotid artery due to MRA technique/artifact. No visible significant (greater than 50%) stenosis.  Approximately 2 mm inferiorly directed outpouching arising from the supraclinoid right ICA, favor infundibulum over aneurysm given small vessel which probably originates from the tip.  Left carotid system: Nondiagnostic evaluation of the left common carotid artery origin and proximal common carotid artery due to MRA technique/artifact. No visible significant (greater than 50%) stenosis.  Vertebral arteries: Nondiagnostic evaluation of the proximal vertebral arteries. Also, nondiagnostic evaluation of the vertebral arteries at the C1-C2 level due to in plane flow. The visible vertebral arteries are patent without significant (greater than 50% stenosis.  IMPRESSION: MRA head:  1. Right P1 PCA occlusion. 2. Approximately 2 mm inferiorly directed outpouching arising from the supraclinoid right ICA, favor infundibulum over aneurysm given small vessel near the tip.  MRI:  Multiple small acute infarcts within the right PCA territory, including the right thalamus and occipital lobe. Slight edema without mass effect.  MRA neck:  No visible significant (greater than 50%) stenosis with nondiagnostic evaluation proximally, detailed above.  Findings discussed with Dr. Livia Snellen via telephone at 6:25 PM.   Electronically Signed   By: Margaretha Sheffield M.D.   On: 07/01/2021 18:26   MR ANGIO HEAD WO CONTRAST   Narrative   CLINICAL DATA:  Neuro deficit, acute, stroke suspected  EXAM: MRI HEAD WITHOUT CONTRAST  MRA HEAD WITHOUT CONTRAST  MRA NECK WITHOUT CONTRAST  TECHNIQUE: Multiplanar, multiecho pulse sequences of the brain and surrounding structures were obtained without intravenous contrast. Angiographic images of the Circle of Willis were obtained using MRA technique without intravenous contrast. Angiographic images of the neck were obtained using MRA technique without intravenous contrast. Carotid stenosis measurements (when applicable) are obtained utilizing NASCET  criteria, using the distal internal carotid diameter as the denominator.  COMPARISON:  CT head from the same day.  FINDINGS: MRI HEAD FINDINGS  Brain: Multiple small acute infarcts within the right PCA territory, including the right thalamus and occipital lobe. No hydrocephalus, acute hemorrhage, midline shift, mass lesion, or visible extra-axial fluid collection mild atrophy.  Vascular: Detailed below.  Skull and upper cervical spine: Normal marrow signal.  Sinuses/Orbits: Mild paranasal sinus mucosal thickening. Unremarkable orbits.  Other: No mastoid effusions.  MRA HEAD FINDINGS  Anterior circulation: Bilateral intracranial ICAs, MCAs, and ACAs are patent without proximal hemodynamically significant stenosis  Posterior circulation: Bilateral intradural vertebral arteries and basilar artery are patent without significant stenosis. Occlusion of the right P1 PCA. Left fetal type PCA. Left PCA is patent without proximal hemodynamically significant stenosis.  MRA NECK FINDINGS  Right carotid system: Nondiagnostic evaluation of the right common carotid artery origin and proximal common carotid artery due to MRA technique/artifact. No visible significant (greater than 50%) stenosis. Approximately 2 mm inferiorly directed outpouching arising from the supraclinoid right ICA, favor infundibulum over aneurysm given small vessel which probably originates from the tip.  Left carotid system: Nondiagnostic evaluation of the left common carotid artery origin and proximal common carotid artery due to MRA technique/artifact. No visible significant (greater than 50%)  stenosis.  Vertebral arteries: Nondiagnostic evaluation of the proximal vertebral arteries. Also, nondiagnostic evaluation of the vertebral arteries at the C1-C2 level due to in plane flow. The visible vertebral arteries are patent without significant (greater than 50% stenosis.  IMPRESSION: MRA head:  1. Right P1  PCA occlusion. 2. Approximately 2 mm inferiorly directed outpouching arising from the supraclinoid right ICA, favor infundibulum over aneurysm given small vessel near the tip.  MRI:  Multiple small acute infarcts within the right PCA territory, including the right thalamus and occipital lobe. Slight edema without mass effect.  MRA neck:  No visible significant (greater than 50%) stenosis with nondiagnostic evaluation proximally, detailed above.  Findings discussed with Dr. Livia Snellen via telephone at 6:25 PM.   Electronically Signed   By: Margaretha Sheffield M.D.   On: 07/01/2021 18:26    PHYSICAL EXAM Gen: Appears well built and developed middle-aged pleasant Caucasian male, resting in ED bed.  CV: RRR.  Resp: No increased WOB.  Extremities: well perfused. No edema to BLEs.  Psych: anxious.    NEURO:  Mental Status: AA&Ox3  Speech/Language: speech is without dysarthria or aphasia.  Naming, repetition, fluency, and comprehension intact.  Cranial Nerves:  II: PERRL. Visual fields full.  III, IV, VI: EOMI. Eyelids elevate symmetrically.  V: Sensation is intact to light touch and symmetrical to face.  VII: Smile is mild facial droop.   VIII: hearing intact to voice. IX, X: Palate elevates symmetrically. Phonation is normal.  [id number] shrug 5/5. XII: tongue is midline without fasciculations. Motor: 5/5 strength to all muscle groups tested except LUE is 4+/5.  Mild left grip weakness.  Diminished fine finger movements on the left.  Orbits right over left upper extremity. Tone: is normal and bulk is normal Sensation- Intact to light touch bilaterally. Extinction absent to DSS.  Subjective paresthesias left cheek and upper extremities but no sensory loss. Coordination: FTN intact bilaterally, HKS: no ataxia in BLE.  Drift noted in left UE and left LE. Arm roll with slowing of orbiting.   DTRs: 2+ throughout Gait- deferred  NIHSS:  1a Level of Conscious.: 0 1b LOC  Questions: 0 1c LOC Commands: 0 2 Best Gaze: 0 3 Visual: 0 4 Facial Palsy: 1 5a Motor Arm - left: 1 5b Motor Arm - Right: 0 6a Motor Leg - Left: 1 6b Motor Leg - Right: 0 7 Limb Ataxia: 0 8 Sensory: 0 9 Best Language: 0 10 Dysarthria: 0 11 Extinct and Inattention.: 0 TOTAL: 3  ASSESSMENT/PLAN Mr. Christian Huynh is a 61 y.o. male with history of HTN who presented with left sided weakness, slurred speech, and left facial droop. He is completing his stroke workup and will stay here for a day or two.    Stroke: multiple small acute infarcts in the left PCA territory including right thalamus and occipital lobe. 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    MRI head multiple small acute infarcts in the left PCA territory including the right thalamus and occipital lobe.  MRA head and neck with  right P1 occlusion. No LVO.  2D Echo read is pending.  TCDs with bubble study ordered.  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  LDL 88 HgbA1c 4.9%  Diet Order             Diet Heart Room service appropriate? Yes; Fluid consistency: Thin  Diet effective now  On no anticoagulants or anti platelet agents prior to admission, now on ASA 81mg  po qd forever. Plavix 75mg  po qd x 21 days.  Patient counseled to be compliant with his antithrombotic medications Ongoing aggressive stroke risk factor management Therapy recommendations:  PT/OT Disposition:  likely home in 1-2 days.   Hypertension Stable.  Permissive hypertension (OK if < 220/120) but gradually normalize in 5-7 days Long-term BP goal normotensive < 130/90  Hyperlipidemia Home meds: none LDL 88.  goal < 70 Add Atorvastatin 20mg  po qd.  Continue statin at discharge  Other stroke risk factors None.   Other Active Problems none  Hospital day # 1  Clance Boll, MSN, APN-BC Neurology Nurse Practitioner Pager [phone]  STROKE MD NOTE :  I have  personally obtained history,examined this patient, reviewed notes, independently viewed imaging studies, participated in medical decision making and plan of care.ROS completed by me personally and pertinent positives fully documented  I have made any additions or clarifications directly to the above note. Agree with note above.  Patient presented with left-sided paresthesias secondary to embolic right PCA branch infarcts with right P1 occlusion.  Given the circumstances of her 13-hour car ride we will check lower extremity venous Dopplers for DVT and TCD bubble study for PFO and paradoxical embolism.  May need loop recorder and TEE later as an outpatient.  Recommend aspirin and Plavix for 3 weeks followed by aspirin alone.  Aggressive risk factor modification.  Long discussion with patient and wife at the bedside and answered questions.  Greater than 50% time during this 50-minute visit was spent on counseling and coordination of care about his embolic stroke and discussion about stroke evaluation, prevention and treatment and answering questions.  Discussed with Dr. [place] Pink.  Antony Contras, MD Medical Director [facility] Pager: [phone] 07/02/2021 3:21 PM  To contact Stroke Continuity provider, please refer to [email or web address] After hours, contact General Neurology

## 2021-07-03 ENCOUNTER — Inpatient Hospital Stay (HOSPITAL_COMMUNITY): Payer: BLUE CROSS/BLUE SHIELD

## 2021-07-03 DIAGNOSIS — R9389 Abnormal findings on diagnostic imaging of other specified body structures: Secondary | ICD-10-CM | POA: Diagnosis not present

## 2021-07-03 DIAGNOSIS — I639 Cerebral infarction, unspecified: Secondary | ICD-10-CM | POA: Diagnosis present

## 2021-07-03 DIAGNOSIS — R471 Dysarthria and anarthria: Secondary | ICD-10-CM | POA: Diagnosis present

## 2021-07-03 DIAGNOSIS — E785 Hyperlipidemia, unspecified: Secondary | ICD-10-CM | POA: Diagnosis present

## 2021-07-03 DIAGNOSIS — Z79899 Other long term (current) drug therapy: Secondary | ICD-10-CM | POA: Diagnosis not present

## 2021-07-03 DIAGNOSIS — I63531 Cerebral infarction due to unspecified occlusion or stenosis of right posterior cerebral artery: Secondary | ICD-10-CM | POA: Diagnosis present

## 2021-07-03 DIAGNOSIS — D751 Secondary polycythemia: Secondary | ICD-10-CM | POA: Diagnosis present

## 2021-07-03 DIAGNOSIS — E871 Hypo-osmolality and hyponatremia: Secondary | ICD-10-CM | POA: Diagnosis present

## 2021-07-03 DIAGNOSIS — R29705 NIHSS score 5: Secondary | ICD-10-CM | POA: Diagnosis present

## 2021-07-03 DIAGNOSIS — Z20822 Contact with and (suspected) exposure to covid-19: Secondary | ICD-10-CM | POA: Diagnosis present

## 2021-07-03 DIAGNOSIS — I10 Essential (primary) hypertension: Secondary | ICD-10-CM | POA: Diagnosis present

## 2021-07-03 DIAGNOSIS — G8194 Hemiplegia, unspecified affecting left nondominant side: Secondary | ICD-10-CM | POA: Diagnosis present

## 2021-07-03 DIAGNOSIS — Z823 Family history of stroke: Secondary | ICD-10-CM | POA: Diagnosis not present

## 2021-07-03 DIAGNOSIS — R29810 Facial weakness: Secondary | ICD-10-CM | POA: Diagnosis present

## 2021-07-03 LAB — BASIC METABOLIC PANEL
Anion gap: 7 (ref 5–15)
BUN: 14 mg/dL (ref 8–23)
CO2: 25 mmol/L (ref 22–32)
Calcium: 8.8 mg/dL — ABNORMAL LOW (ref 8.9–10.3)
Chloride: 107 mmol/L (ref 98–111)
Creatinine, Ser: 1.2 mg/dL (ref 0.61–1.24)
GFR, Estimated: >60 mL/min (ref >60)
Glucose, Bld: 96 mg/dL (ref 70–99)
Potassium: 3.6 mmol/L (ref 3.5–5.1)
Sodium: 139 mmol/L (ref 135–145)

## 2021-07-03 LAB — HIV-1 RNA QUANT-NO REFLEX-BLD
HIV 1 RNA Quant: <20 copies/mL
LOG10 HIV-1 RNA: UNDETERMINED log10copy/mL

## 2021-07-03 LAB — CBC
HCT: 47.8 % (ref 39.0–52.0)
Hemoglobin: 16.9 g/dL (ref 13.0–17.0)
MCH: 31.6 pg (ref 26.0–34.0)
MCHC: 35.4 g/dL (ref 30.0–36.0)
MCV: 89.3 fL (ref 80.0–100.0)
Platelets: 164 10*3/uL (ref 150–400)
RBC: 5.35 MIL/uL (ref 4.22–5.81)
RDW: 12.4 % (ref 11.5–15.5)
WBC: 6.8 10*3/uL (ref 4.0–10.5)
nRBC: 0 % (ref 0.0–0.2)

## 2021-07-03 LAB — SEDIMENTATION RATE: Sed Rate: 1 mm/hr (ref 0–16)

## 2021-07-03 LAB — CK: Total CK: 79 U/L (ref 49–397)

## 2021-07-03 LAB — MAGNESIUM: Magnesium: 2.1 mg/dL (ref 1.7–2.4)

## 2021-07-03 LAB — RPR: RPR Ser Ql: NONREACTIVE

## 2021-07-03 LAB — C-REACTIVE PROTEIN: CRP: 0.5 mg/dL (ref <1.0)

## 2021-07-03 LAB — TSH: TSH: 1.48 u[IU]/mL (ref 0.350–4.500)

## 2021-07-03 LAB — VITAMIN B12: Vitamin B-12: 490 pg/mL (ref 180–914)

## 2021-07-03 MED ORDER — ATORVASTATIN CALCIUM 40 MG PO TABS: 40.0000 mg | ORAL_TABLET | Freq: Every day | ORAL | 2 refills | Status: DC

## 2021-07-03 MED ORDER — ASPIRIN EC 81 MG PO TBEC: 81.0000 mg | DELAYED_RELEASE_TABLET | Freq: Every day | ORAL | 11 refills | Status: DC

## 2021-07-03 MED ORDER — STROKE: EARLY STAGES OF RECOVERY BOOK
Status: AC
  Filled 2021-07-03: qty 1

## 2021-07-03 MED ORDER — CLOPIDOGREL BISULFATE 75 MG PO TABS: 75.0000 mg | ORAL_TABLET | Freq: Every day | ORAL | 0 refills | Status: AC

## 2021-07-03 MED ORDER — CLOPIDOGREL BISULFATE 75 MG PO TABS: 75.0000 mg | ORAL_TABLET | Freq: Every day | ORAL | 0 refills | Status: DC

## 2021-07-03 NOTE — Progress Notes (Addendum)
[facility] notified of dc order. Piv dcd. Site unremarkable. Patient and wife verbalized understanding of dc instructions. Stroke education book given to patients wife. Son is coming to take patient home via car.

## 2021-07-03 NOTE — Progress Notes (Signed)
VASCULAR LAB    Bilateral lower extremity venous duplex has been performed.  See CV proc for preliminary results.   Wally Shevchenko, RVT 07/03/2021, 10:17 AM

## 2021-07-03 NOTE — Plan of Care (Signed)
°  Problem: Acute Rehab OT Goals (only OT should resolve) Goal: Pt. Will Perform Grooming Outcome: Adequate for Discharge Goal: Pt. Will Perform Upper Body Dressing Outcome: Adequate for Discharge Goal: Pt. Will Perform Lower Body Dressing Outcome: Adequate for Discharge Goal: Pt. Will Transfer To Toilet Outcome: Adequate for Discharge Goal: Pt. Will Perform Toileting-Clothing Manipulation Outcome: Adequate for Discharge Goal: OT Additional ADL Goal #1 Outcome: Adequate for Discharge   Problem: Acute Rehab OT Goals (only OT should resolve) Goal: Pt/Caregiver Will Perform Home Exercise Program Outcome: Adequate for Discharge   Problem: Acute Rehab PT Goals(only PT should resolve) Goal: Pt Will Go Supine/Side To Sit Outcome: Adequate for Discharge Goal: Pt Will Go Sit To Supine/Side Outcome: Adequate for Discharge Goal: Patient Will Transfer Sit To/From Stand Outcome: Adequate for Discharge Goal: Pt Will Ambulate Outcome: Adequate for Discharge   Problem: Education: Goal: Knowledge of disease or condition will improve 07/03/2021 1127 by Drue Dun, RN Outcome: Adequate for Discharge 07/03/2021 0821 by Drue Dun, RN Outcome: Progressing Goal: Knowledge of secondary prevention will improve (SELECT ALL) 07/03/2021 1127 by Drue Dun, RN Outcome: Adequate for Discharge 07/03/2021 0821 by Drue Dun, RN Outcome: Progressing Goal: Knowledge of patient specific risk factors will improve (INDIVIDUALIZE FOR PATIENT) 07/03/2021 1127 by Drue Dun, RN Outcome: Adequate for Discharge 07/03/2021 0821 by Drue Dun, RN Outcome: Progressing   Problem: Coping: Goal: Will verbalize positive feelings about self 07/03/2021 1127 by Drue Dun, RN Outcome: Adequate for Discharge 07/03/2021 0821 by Drue Dun, RN Outcome: Progressing   Problem: Self-Care: Goal: Ability to participate in self-care as condition permits will improve 07/03/2021 1127 by Drue Dun, RN Outcome: Adequate for Discharge 07/03/2021 0821 by Drue Dun, RN Outcome: Progressing   Problem: Nutrition: Goal: Risk of aspiration will decrease 07/03/2021 1127 by Drue Dun, RN Outcome: Adequate for Discharge 07/03/2021 0821 by Drue Dun, RN Outcome: Progressing   Problem: Ischemic Stroke/TIA Tissue Perfusion: Goal: Complications of ischemic stroke/TIA will be minimized 07/03/2021 1127 by Drue Dun, RN Outcome: Adequate for Discharge 07/03/2021 0821 by Drue Dun, RN Outcome: Progressing

## 2021-07-03 NOTE — Plan of Care (Signed)
°  Problem: Education: °Goal: Knowledge of disease or condition will improve °Outcome: Progressing °Goal: Knowledge of secondary prevention will improve (SELECT ALL) °Outcome: Progressing °Goal: Knowledge of patient specific risk factors will improve (INDIVIDUALIZE FOR PATIENT) °Outcome: Progressing °  °Problem: Coping: °Goal: Will verbalize positive feelings about self °Outcome: Progressing °  °Problem: Self-Care: °Goal: Ability to participate in self-care as condition permits will improve °Outcome: Progressing °  °Problem: Nutrition: °Goal: Risk of aspiration will decrease °Outcome: Progressing °  °Problem: Ischemic Stroke/TIA Tissue Perfusion: °Goal: Complications of ischemic stroke/TIA will be minimized °Outcome: Progressing °  °

## 2021-07-03 NOTE — Progress Notes (Signed)
Physical Therapy Treatment Patient Details Name: Christian Huynh MRN: [id number] DOB: 1961/05/31 Today's Date: 07/03/2021   History of Present Illness 61 y.o. male admitted 07/01/21 with complaints of left-sided weakness Arm>leg and face. 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 PMH includes HTN.    PT Comments    Pt required min guard assist transfers, min guard assist ambulation with RW 200', and min assist amb 120' without AD. Mild weakness persists LLE with decreased dorsiflexion noted during gait. Pt also presenting with mild L inattention. Pt/wife educated on strategies for home to address deficits. Current POC remains appropriate.    Recommendations for follow up therapy are one component of a multi-disciplinary discharge planning process, led by the attending physician.  Recommendations may be updated based on patient status, additional functional criteria and insurance authorization.  Follow Up Recommendations  Outpatient PT (neuro OP in [place])     Assistance Recommended at Discharge Frequent or constant Supervision/Assistance  Patient can return home with the following A little help with walking and/or transfers;A little help with bathing/dressing/bathroom   Equipment Recommendations  Rolling walker (2 wheels)    Recommendations for Other Services       Precautions / Restrictions Precautions Precautions: Fall     Mobility  Bed Mobility Overal bed mobility: Modified Independent                  Transfers Overall transfer level: Needs assistance Equipment used: None;Rolling walker (2 wheels) Transfers: Sit to/from Stand Sit to Stand: Min guard           General transfer comment: increased time to power up and stabilize balance    Ambulation/Gait Ambulation/Gait assistance: Min assist;Min guard Gait Distance (Feet): 200 Feet (+ 120') Assistive device: Rolling walker (2  wheels);None Gait Pattern/deviations: Steppage;Step-through pattern;Decreased stride length;Decreased dorsiflexion - left Gait velocity: Decreased Gait velocity interpretation: 1.31 - 2.62 ft/sec, indicative of limited community ambulator   General Gait Details: steppage gait on L to accomodate decreased dorsiflexion. Amb 120' min assist without AD. Amb 200' with RW min guard assist.   Stairs             Wheelchair Mobility    Modified Rankin (Stroke Patients Only) Modified Rankin (Stroke Patients Only) Pre-Morbid Rankin Score: No symptoms Modified Rankin: Moderately severe disability     Balance Overall balance assessment: Needs assistance Sitting-balance support: No upper extremity supported;Feet supported Sitting balance-Leahy Scale: Good Sitting balance - Comments: able to don socks EOB   Standing balance support: No upper extremity supported;Bilateral upper extremity supported;During functional activity Standing balance-Leahy Scale: Fair Standing balance comment: improved stability with RW                            Cognition Arousal/Alertness: Awake/alert Behavior During Therapy: Flat affect Overall Cognitive Status: Impaired/Different from baseline Area of Impairment: Safety/judgement;Awareness                         Safety/Judgement: Decreased awareness of safety;Decreased awareness of deficits Awareness: Emergent            Exercises      General Comments General comments (skin integrity, edema, etc.): mild L inattention      Pertinent Vitals/Pain Pain Assessment: No/denies pain    Home Living  Prior Function            PT Goals (current goals can now be found in the care plan section) Acute Rehab PT Goals Patient Stated Goal: to go home to [facility] Progress towards PT goals: Progressing toward goals    Frequency    Min 4X/week      PT Plan Current plan remains appropriate     Co-evaluation              AM-PAC PT "6 Clicks" Mobility   Outcome Measure  Help needed turning from your back to your side while in a flat bed without using bedrails?: None Help needed moving from lying on your back to sitting on the side of a flat bed without using bedrails?: None Help needed moving to and from a bed to a chair (including a wheelchair)?: A Little Help needed standing up from a chair using your arms (e.g., wheelchair or bedside chair)?: A Little Help needed to walk in hospital room?: A Little Help needed climbing 3-5 steps with a railing? : A Lot 6 Click Score: 19    End of Session Equipment Utilized During Treatment: Gait belt Activity Tolerance: Patient tolerated treatment well Patient left: in bed;with call bell/phone within reach;with family/visitor present Nurse Communication: Mobility status PT Visit Diagnosis: Unsteadiness on feet (R26.81);Muscle weakness (generalized) (M62.81)     Time: 1638-4665 PT Time Calculation (min) (ACUTE ONLY): 18 min  Charges:  $Gait Training: 8-22 mins                     Aida Raider, PT  Office # [phone] Pager [phone]    Ilda Foil 07/03/2021, 11:26 AM

## 2021-07-04 LAB — ANA W/REFLEX IF POSITIVE: Anti Nuclear Antibody (ANA): NEGATIVE

## 2021-07-04 LAB — LUPUS ANTICOAGULANT PANEL
DRVVT: 38.1 s (ref 0.0–47.0)
PTT Lupus Anticoagulant: 39.8 s (ref 0.0–51.9)

## 2021-07-05 ENCOUNTER — Telehealth: Payer: Self-pay | Admitting: Neurology

## 2021-07-05 LAB — BETA-2-GLYCOPROTEIN I ABS, IGG/M/A
Beta-2 Glyco I IgG: 50 GPI IgG units — ABNORMAL HIGH (ref 0–20)
Beta-2-Glycoprotein I IgA: <9 GPI IgA units (ref 0–25)
Beta-2-Glycoprotein I IgM: <9 GPI IgM units (ref 0–32)

## 2021-07-05 LAB — HOMOCYSTEINE: Homocysteine: 11.7 umol/L (ref 0.0–17.2)

## 2021-07-05 NOTE — Telephone Encounter (Signed)
Update I spoke with [facility] and they currently do not have anything available before patient is currently scheduled. [place] who I spoke with has added patient to cancellation list in case of any openings.

## 2021-07-05 NOTE — Telephone Encounter (Signed)
Patient's wife called in to set up outpatient TEE. I reviewed chart and patient was recently admitted for an acute stroke. Had work up by Dr. Pearlean Brownie and Dr. Roda Shutters. Dr. Pearlean Brownie recommended outpatient TEE and loop recorder "to be arranged in the next week". I advised wife that it looks like patient is scheduled see Cardiology 07/26/21 for consult for TEE as we do not do those here. Wife stated this is unacceptable and they need to be seen sooner as they are from [place] and needing to return home and that Dr. Pearlean Brownie had wanted this done before they leave to go back to [place]. I let her know I'd reach out to  Dr. Pearlean Brownie to see what he recommends but to reach back out to the cardio office to see if they had any sooner openings in the meantime. FYI they are not wanting to do a follow up here for neuro as they will address this at home.

## 2021-07-05 NOTE — Telephone Encounter (Signed)
Please Advise

## 2021-07-06 ENCOUNTER — Other Ambulatory Visit: Payer: Self-pay | Admitting: *Deleted

## 2021-07-06 ENCOUNTER — Telehealth: Payer: Self-pay | Admitting: *Deleted

## 2021-07-06 DIAGNOSIS — I639 Cerebral infarction, unspecified: Secondary | ICD-10-CM

## 2021-07-06 LAB — QUANTIFERON-TB GOLD PLUS (RQFGPL)
QuantiFERON Mitogen Value: >10 IU/mL
QuantiFERON Nil Value: 0.05 IU/mL
QuantiFERON TB1 Ag Value: 0.04 IU/mL
QuantiFERON TB2 Ag Value: 0.05 IU/mL

## 2021-07-06 LAB — QUANTIFERON-TB GOLD PLUS: QuantiFERON-TB Gold Plus: NEGATIVE

## 2021-07-06 LAB — CARDIOLIPIN ANTIBODIES, IGG, IGM, IGA
Anticardiolipin IgA: <9 APL U/mL (ref 0–11)
Anticardiolipin IgG: 23 GPL U/mL — ABNORMAL HIGH (ref 0–14)
Anticardiolipin IgM: 13 MPL U/mL — ABNORMAL HIGH (ref 0–12)

## 2021-07-06 NOTE — Patient Outreach (Signed)
Received a red flag Emmi stroke notification for Christian Huynh.  °I have assigned Monica Lane, RN to call for follow up and determine if there are any Case Management needs.  °  °Gerrick Ray, CBCS, CMAA °[facility] Care Management Assistant °[facility] Care Management °[phone]  °

## 2021-07-06 NOTE — Telephone Encounter (Signed)
I spoke to the patient's wife. She is aware we are trying to get the ECHO TEE scheduled quickly. Our referral team will have to check his insurance and test availability at the hospital. We cannot guarantee what date the test will occur. For this reason, they are going to return home to [place] to complete his work-up. Dr. Pearlean Brownie will be made aware of this decision. Right now, he has an appt to see his PCP on 08/10/21. I have informed her that we would like for him to be seen sooner. She is going to call his PCP again to ask his appt be moved to an earlier date. I encouraged her to call that office frequently for cancellations. She will get his PCP to provide local referrals to neurology and cardiology.

## 2021-07-06 NOTE — Patient Outreach (Signed)
[facility]) Care Management  07/06/2021  Christian Huynh March 28, 1961 [id number]   RED ON EMMI ALERT - Stroke Day # 1 Date: 1/9 Red Alert Reason: Scheduled follow up appointment?  NO   Outreach attempt #1, successful to member and wife.  Identity verified.  This care manager introduced self and stated purpose of call.  [facility] care management services explained.    Member report he is doing well since discharge.  Denies questions regarding medications, does have concern about follow up appointments.  They are from [facility] and was here visiting when member had medical emergency.  Wife is concerned about consult with cardiology on 1/30, state they were told that the TEE itself would be done prior to them going back home.  She verbalizes understanding regarding difficulty obtaining appointments but would like further instructions as to if they should go back to [facility] and continue management there, return to [facility] and come back to [facility] for appointments or if they should just wait in [place] until office visits has been completed.  Their preference would be to go back to [facility] because they both have jobs to get back to however member will need referrals for specialists in that area.  Wife has reached out to PCP in [facility], earliest appointment is 2/14, they are unable to do referrals until member is seen in their office.    Member and wife made aware per chart (neurology note) that referral is pending to a different cardiology office that may be able to get member in sooner to answer questions and provide advice.  Call placed to office to follow up on referral, message left with referral coordinator.    Plan: RN CM will follow up with Neurology office regarding referral to cardiology and provide update to member/wife as they are trying to make a decision regarding when to return to [facility].  Kemper Durie, [place], MSN [facility] Care Management  [facility] Manager [phone]

## 2021-07-07 ENCOUNTER — Other Ambulatory Visit: Payer: Self-pay | Admitting: *Deleted

## 2021-07-07 LAB — PHOSPHATIDYLSERINE ANTIBODIES
Phosphatydalserine, IgA: <1 APS Units (ref 0–19)
Phosphatydalserine, IgG: 88 Units — ABNORMAL HIGH (ref 0–30)
Phosphatydalserine, IgM: 41 Units — ABNORMAL HIGH (ref 0–30)

## 2021-07-07 NOTE — Telephone Encounter (Signed)
Checking to see if PA is required by patient's insurance for Echo TEE. CPT [id number]

## 2021-07-07 NOTE — Telephone Encounter (Signed)
Christian Shook, RN 4:49 PM Note I spoke to the patient's wife. She is aware we are trying to get the ECHO TEE scheduled quickly. Our referral team will have to check his insurance and test availability at the hospital. We cannot guarantee what date the test will occur. For this reason, they are going to return home to [place] to complete his work-up. Dr. Pearlean Brownie will be made aware of this decision. Right now, he has an appt to see his PCP on 08/10/21. I have informed her that we would like for him to be seen sooner. She is going to call his PCP again to ask his appt be moved to an earlier date. I encouraged her to call that office frequently for cancellations. She will get his PCP to provide local referrals to neurology and cardiology.

## 2021-07-07 NOTE — Patient Outreach (Signed)
[facility]) Care Management  07/07/2021  Christian Huynh [birth year] [id number]   Outgoing call placed to member/wife to follow up on appointments and referrals.  No answer, HIPAA compliant voice message left.  Contact was made with York Pellant, RN at neurology office, notified that she was able to speak with wife.  There has still been a process to have referrals done quickly, unable to guarantee the speed of process therefore member and wife will return to [facility] and proceed with follow up there.  Will close case at this time.  Kemper Durie, RN, MSN, CCM [facility] Care Management  [facility] Manager [phone]

## 2021-07-19 ENCOUNTER — Other Ambulatory Visit: Payer: Self-pay | Admitting: *Deleted

## 2021-07-19 NOTE — Patient Outreach (Signed)
Received a red flag Emmi stroke notification for Mr. Ransford.  I have assigned Kemper Durie, RN to call for follow up and determine if there are any Case Management needs.    Iverson Alamin, Donivan Scull [facility] Care Management Assistant [facility] Care Management [phone]

## 2021-07-19 NOTE — Patient Outreach (Signed)
[facility]) Care Management  07/19/2021  Christian Huynh [birth year] [id number]   RED ON EMMI ALERT - Stroke Day # 13 Date: 1/21 Red Alert Reason: Not been to follow up appointment   Outreach attempt #1, successful.    Spoke to wife, husband in care with her.  State he is doing well, almost back to normal.  They are back in [place] and has connected with a local PCP that he will see in March.  In the meantime, he will have appointment with PA next week who will place referrals to cardiology, Neurology, and Pulmonology.  Denies any urgent concerns, encouraged to contact this care manager with questions.     Plan: RN CM will close case at this time, no further needs identified.  Kemper Durie, RN, MSN, CCM [facility] Care Management  [facility] Manager [phone]

## 2021-07-26 ENCOUNTER — Institutional Professional Consult (permissible substitution): Payer: BLUE CROSS/BLUE SHIELD | Admitting: Internal Medicine

## 2021-07-26 DIAGNOSIS — I639 Cerebral infarction, unspecified: Secondary | ICD-10-CM

## 2024-06-07 ENCOUNTER — Ambulatory Visit: Admitting: Physician Assistant

## 2024-06-07 ENCOUNTER — Encounter: Payer: Self-pay | Admitting: Physician Assistant

## 2024-06-07 VITALS — BP 132/90 | HR 97 | Temp 97.7°F | Ht 69.0 in | Wt 187.2 lb

## 2024-06-07 DIAGNOSIS — E7849 Other hyperlipidemia: Secondary | ICD-10-CM

## 2024-06-07 DIAGNOSIS — Z125 Encounter for screening for malignant neoplasm of prostate: Secondary | ICD-10-CM

## 2024-06-07 DIAGNOSIS — Z7689 Persons encountering health services in other specified circumstances: Secondary | ICD-10-CM

## 2024-06-07 DIAGNOSIS — I10 Essential (primary) hypertension: Secondary | ICD-10-CM

## 2024-06-07 DIAGNOSIS — Z8673 Personal history of transient ischemic attack (TIA), and cerebral infarction without residual deficits: Secondary | ICD-10-CM

## 2024-06-07 DIAGNOSIS — Z0000 Encounter for general adult medical examination without abnormal findings: Secondary | ICD-10-CM | POA: Insufficient documentation

## 2024-06-07 MED ORDER — ATORVASTATIN CALCIUM 20 MG PO TABS: 20.0000 mg | ORAL_TABLET | Freq: Every day | ORAL | 1 refills | Status: DC

## 2024-06-07 MED ORDER — HYDROCHLOROTHIAZIDE 12.5 MG PO TABS: 12.5000 mg | ORAL_TABLET | Freq: Every day | ORAL | 1 refills | Status: DC

## 2024-06-07 MED ORDER — ALLOPURINOL 100 MG PO TABS: 100.0000 mg | ORAL_TABLET | Freq: Every day | ORAL | 1 refills | Status: AC

## 2024-06-07 MED ORDER — LOSARTAN POTASSIUM 50 MG PO TABS: 50.0000 mg | ORAL_TABLET | Freq: Every day | ORAL | 1 refills | Status: DC

## 2024-06-07 MED ORDER — ASPIRIN 325 MG PO TBEC: 325.0000 mg | DELAYED_RELEASE_TABLET | Freq: Every day | ORAL | 1 refills | Status: AC

## 2024-06-07 NOTE — Assessment & Plan Note (Signed)
 Safety measures discussed. Immunizations reviewed: declines flu shot today.  Diet and exercise/ lifestyle modifications discussed: Recommend 150 minutes per week of exercise such as walking. Recommend lots of fresh produce to include fruits, vegetables, beans, healthy fats such as avocado, nuts, seeds, and 3-6 ounces of protein at each meal.  Avoid fried foods and fast food. Limit alcohol consumption: no more than one drink per day for women and 2 drinks per day for men.  Stress management discussed. Routine vision and dental screening discussed: recommend dentist every 6 months, gets vision checked every 1-2 years.  Health maintenance: Colonoscopy reportedly up to date, PSA ordered today.  Questions answered.

## 2024-06-07 NOTE — Assessment & Plan Note (Signed)
 132/90 Controlled. Continue current medications. No change in management. Discussed DASH diet and dietary sodium restrictions.  Continue dietary efforts and physical activity.

## 2024-06-07 NOTE — Progress Notes (Signed)
 New Patient Office Visit  Subjective    Patient ID: Christian Huynh, male    DOB: 01-06-1961  Age: 63 y.o. MRN: [id number]  CC:  Chief Complaint  Patient presents with   New Patient (Initial Visit)    Patient is a new patient and did no state any complaints.    HPI Christian Huynh presents to establish care  Discussed the use of AI scribe software for clinical note transcription with the patient, who gave verbal consent to proceed.  History of Present Illness Christian Huynh is a 63 year old male who presents to establish care and for annual exam.  He had a stroke three years ago with no recurrence. Since then he feels more tired, notes altered taste with most foods except chicken, and has mildly unsteady balance without major deficits. He follows with neurology yearly and is in need of a referral today, as he has recently relocated from [place] .   He has hypertension and hyperlipidemia. He takes allopurinol for gout prevention.  He has not had recent blood work. His last colonoscopy was five years ago and normal, with a ten-year follow-up plan. He has avoided flu vaccination since a prior adverse reaction.  He works in the stage manager with frequent travel and is physically active with work and home projects. He does not smoke and drinks alcohol occasionally, usually a margarita when dining out. He reports a diet high in sweets but does not add salt.  He had panoptic lens surgery about 1.5 to 2 years ago with improved vision, though he sometimes needs breaks from computer work. He has regular dental care.    Outpatient Encounter Medications as of 06/07/2024  Medication Sig   [DISCONTINUED] allopurinol (ZYLOPRIM) 100 MG tablet Take 100 mg by mouth daily.   [DISCONTINUED] aspirin  EC 325 MG tablet Take 325 mg by mouth daily.   [DISCONTINUED] aspirin  EC 81 MG tablet Take 1 tablet (81 mg total) by mouth daily. Swallow whole. (Patient taking differently: Take 325 mg by  mouth daily. Swallow whole.)   [DISCONTINUED] atorvastatin  (LIPITOR) 20 MG tablet Take 20 mg by mouth daily.   [DISCONTINUED] atorvastatin  (LIPITOR) 40 MG tablet Take 1 tablet (40 mg total) by mouth daily. (Patient taking differently: Take 20 mg by mouth daily.)   [DISCONTINUED] hydrochlorothiazide (HYDRODIURIL) 12.5 MG tablet Take 12.5 mg by mouth daily.   [DISCONTINUED] losartan (COZAAR) 50 MG tablet Take 50 mg by mouth daily.   allopurinol (ZYLOPRIM) 100 MG tablet Take 1 tablet (100 mg total) by mouth daily.   aspirin  EC 325 MG tablet Take 1 tablet (325 mg total) by mouth daily.   atorvastatin  (LIPITOR) 20 MG tablet Take 1 tablet (20 mg total) by mouth daily.   hydrochlorothiazide (HYDRODIURIL) 12.5 MG tablet Take 1 tablet (12.5 mg total) by mouth daily.   losartan (COZAAR) 50 MG tablet Take 1 tablet (50 mg total) by mouth daily.   No facility-administered encounter medications on file as of 06/07/2024.    Past Medical History:  Diagnosis Date   Hypertension     History reviewed. No pertinent surgical history.  Family History  Problem Relation Age of Onset   Stroke Mother    Pancreatic cancer Father     Social History   Socioeconomic History   Marital status: Married    Spouse name: Not on file   Number of children: Not on file   Years of education: Not on file   Highest education level: Not on file  Occupational History  Not on file  Tobacco Use   Smoking status: Never   Smokeless tobacco: Never  Substance and Sexual Activity   Alcohol use: Yes    Comment: rare   Drug use: Never   Sexual activity: Not on file  Other Topics Concern   Not on file  Social History Narrative   Not on file   Social Drivers of Health   Tobacco Use: Low Risk (06/07/2024)   Patient History    Smoking Tobacco Use: Never    Smokeless Tobacco Use: Never    Passive Exposure: Not on file  Financial Resource Strain: Not on file  Food Insecurity: Not on file  Transportation Needs: Not  on file  Physical Activity: Not on file  Stress: Not on file  Social Connections: Not on file  Intimate Partner Violence: Not on file  Depression (PHQ2-9): Low Risk (06/07/2024)   Depression (PHQ2-9)    PHQ-2 Score: 3  Alcohol Screen: Not on file  Housing: Not on file  Utilities: Not on file  Health Literacy: Not on file    Review of Systems  Constitutional:  Negative for activity change, appetite change, fatigue and fever.  Eyes:  Negative for visual disturbance.  Respiratory:  Negative for cough and shortness of breath.   Cardiovascular:  Negative for chest pain.  Gastrointestinal:  Negative for constipation, diarrhea and vomiting.  Musculoskeletal:  Negative for back pain, gait problem and myalgias.  Neurological:  Negative for light-headedness and headaches.  Psychiatric/Behavioral:  Negative for agitation and decreased concentration. The patient is not nervous/anxious.          Objective    BP (!) 132/90   Pulse 97   Temp 97.7 F (36.5 C)   Ht 5' 9 (1.753 m)   Wt 187 lb 3.2 oz (84.9 kg)   SpO2 98%   BMI 27.64 kg/m   Physical Exam Constitutional:      General: He is not in acute distress.    Appearance: Normal appearance. He is normal weight. He is not ill-appearing.  HENT:     Head: Normocephalic and atraumatic.     Mouth/Throat:     Mouth: Mucous membranes are moist.     Pharynx: Oropharynx is clear.  Eyes:     Extraocular Movements: Extraocular movements intact.     Conjunctiva/sclera: Conjunctivae normal.  Cardiovascular:     Rate and Rhythm: Normal rate and regular rhythm.     Heart sounds: Normal heart sounds. No murmur heard. Pulmonary:     Effort: Pulmonary effort is normal.     Breath sounds: Normal breath sounds. No wheezing, rhonchi or rales.  Musculoskeletal:     Right lower leg: No edema.     Left lower leg: No edema.  Skin:    General: Skin is warm and dry.  Neurological:     General: No focal deficit present.     Mental Status: He  is alert and oriented to person, place, and time.  Psychiatric:        Mood and Affect: Mood normal.        Behavior: Behavior normal.       Assessment & Plan:  Encounter to establish care  Annual visit for general adult medical examination without abnormal findings Assessment & Plan: Safety measures discussed. Immunizations reviewed: declines flu shot today.  Diet and exercise/ lifestyle modifications discussed: Recommend 150 minutes per week of exercise such as walking. Recommend lots of fresh produce to include fruits, vegetables, beans, healthy fats such  as avocado, nuts, seeds, and 3-6 ounces of protein at each meal.  Avoid fried foods and fast food. Limit alcohol consumption: no more than one drink per day for women and 2 drinks per day for men.  Stress management discussed. Routine vision and dental screening discussed: recommend dentist every 6 months, gets vision checked every 1-2 years.  Health maintenance: Colonoscopy reportedly up to date, PSA ordered today.  Questions answered.    Essential hypertension Assessment & Plan: 132/90 Controlled. Continue current medications. No change in management. Discussed DASH diet and dietary sodium restrictions.  Continue dietary efforts and physical activity.   Orders: -     Ambulatory referral to Cardiology -     CMP14+EGFR -     CBC with Differential/Platelet  History of CVA (cerebrovascular accident) -     Ambulatory referral to Neurology  Other hyperlipidemia -     Lipid panel  Screening for prostate cancer -     PSA  Other orders -     Allopurinol; Take 1 tablet (100 mg total) by mouth daily.  Dispense: 90 tablet; Refill: 1 -     Aspirin ; Take 1 tablet (325 mg total) by mouth daily.  Dispense: 90 tablet; Refill: 1 -     Atorvastatin  Calcium ; Take 1 tablet (20 mg total) by mouth daily.  Dispense: 90 tablet; Refill: 1 -     hydroCHLOROthiazide; Take 1 tablet (12.5 mg total) by mouth daily.  Dispense: 90 tablet;  Refill: 1 -     Losartan Potassium; Take 1 tablet (50 mg total) by mouth daily.  Dispense: 90 tablet; Refill: 1    Return in about 6 months (around 12/06/2024) for BP/HLD.   Charmaine Towanna Avery, PA-C

## 2024-06-08 LAB — LIPID PANEL
Chol/HDL Ratio: 3.4 ratio (ref 0.0–5.0)
Cholesterol, Total: 100 mg/dL (ref 100–199)
HDL: 29 mg/dL — ABNORMAL LOW (ref >39)
LDL Chol Calc (NIH): 50 mg/dL (ref 0–99)
Triglycerides: 115 mg/dL (ref 0–149)
VLDL Cholesterol Cal: 21 mg/dL (ref 5–40)

## 2024-06-08 LAB — CMP14+EGFR
ALT: 40 IU/L (ref 0–44)
AST: 37 IU/L (ref 0–40)
Albumin: 4.6 g/dL (ref 3.9–4.9)
Alkaline Phosphatase: 113 IU/L (ref 47–123)
BUN/Creatinine Ratio: 13 (ref 10–24)
BUN: 14 mg/dL (ref 8–27)
Bilirubin Total: 1.4 mg/dL — ABNORMAL HIGH (ref 0.0–1.2)
CO2: 25 mmol/L (ref 20–29)
Calcium: 9.6 mg/dL (ref 8.6–10.2)
Chloride: 100 mmol/L (ref 96–106)
Creatinine, Ser: 1.12 mg/dL (ref 0.76–1.27)
Globulin, Total: 2.6 g/dL (ref 1.5–4.5)
Glucose: 74 mg/dL (ref 70–99)
Potassium: 4.1 mmol/L (ref 3.5–5.2)
Sodium: 142 mmol/L (ref 134–144)
Total Protein: 7.2 g/dL (ref 6.0–8.5)
eGFR: 73 mL/min/1.73 (ref >59)

## 2024-06-08 LAB — CBC WITH DIFFERENTIAL/PLATELET
Basophils Absolute: 0.1 x10E3/uL (ref 0.0–0.2)
Basos: 1 %
EOS (ABSOLUTE): 0.5 x10E3/uL — ABNORMAL HIGH (ref 0.0–0.4)
Eos: 6 %
Hematocrit: 53.2 % — ABNORMAL HIGH (ref 37.5–51.0)
Hemoglobin: 17.5 g/dL (ref 13.0–17.7)
Immature Grans (Abs): 0 x10E3/uL (ref 0.0–0.1)
Immature Granulocytes: 0 %
Lymphocytes Absolute: 1.6 x10E3/uL (ref 0.7–3.1)
Lymphs: 22 %
MCH: 30.4 pg (ref 26.6–33.0)
MCHC: 32.9 g/dL (ref 31.5–35.7)
MCV: 92 fL (ref 79–97)
Monocytes Absolute: 0.6 x10E3/uL (ref 0.1–0.9)
Monocytes: 9 %
Neutrophils Absolute: 4.5 x10E3/uL (ref 1.4–7.0)
Neutrophils: 62 %
Platelets: 218 x10E3/uL (ref 150–450)
RBC: 5.76 x10E6/uL (ref 4.14–5.80)
RDW: 12.5 % (ref 11.6–15.4)
WBC: 7.3 x10E3/uL (ref 3.4–10.8)

## 2024-06-08 LAB — PSA: Prostate Specific Ag, Serum: 1 ng/mL (ref 0.0–4.0)

## 2024-06-10 ENCOUNTER — Ambulatory Visit: Payer: Self-pay | Admitting: Physician Assistant

## 2024-07-29 ENCOUNTER — Ambulatory Visit: Admitting: Physician Assistant

## 2024-07-29 ENCOUNTER — Encounter: Payer: Self-pay | Admitting: Cardiology

## 2024-07-29 ENCOUNTER — Ambulatory Visit: Payer: Self-pay | Admitting: Cardiology

## 2024-07-29 VITALS — BP 123/74 | Ht 69.0 in | Wt 189.5 lb

## 2024-07-29 DIAGNOSIS — J84112 Idiopathic pulmonary fibrosis: Secondary | ICD-10-CM | POA: Diagnosis not present

## 2024-07-29 DIAGNOSIS — E7800 Pure hypercholesterolemia, unspecified: Secondary | ICD-10-CM | POA: Diagnosis not present

## 2024-07-29 DIAGNOSIS — I10 Essential (primary) hypertension: Secondary | ICD-10-CM | POA: Diagnosis not present

## 2024-07-29 DIAGNOSIS — Z8673 Personal history of transient ischemic attack (TIA), and cerebral infarction without residual deficits: Secondary | ICD-10-CM

## 2024-07-29 MED ORDER — ATORVASTATIN CALCIUM 20 MG PO TABS: 20.0000 mg | ORAL_TABLET | Freq: Every day | ORAL | 0 refills | Status: AC

## 2024-07-29 MED ORDER — LOSARTAN POTASSIUM-HCTZ 50-12.5 MG PO TABS: 1.0000 | ORAL_TABLET | ORAL | 0 refills | Status: AC

## 2024-07-29 NOTE — Patient Instructions (Addendum)
 Medication Instructions:  Your physician recommends that you continue on your current medications as directed. Please refer to the Current Medication list given to you today.  *If you need a refill on your cardiac medications before your next appointment, please call your pharmacy*  Follow-Up: As needed  We recommend signing up for the patient portal called MyChart.  Sign up information is provided on this After Visit Summary.  MyChart is used to connect with patients for Virtual Visits (Telemedicine).  Patients are able to view lab/test results, encounter notes, upcoming appointments, etc.  Non-urgent messages can be sent to your provider as well.   To learn more about what you can do with MyChart, go to [email or web address].           We recommend signing up for the patient portal called MyChart.  Patients are able to view lab/test results, encounter notes, upcoming appointments, etc.  Non-urgent messages can be sent to your provider as well, go to [email or web address].

## 2024-07-31 ENCOUNTER — Ambulatory Visit

## 2024-07-31 VITALS — BP 128/74 | HR 77 | Temp 97.6°F | Ht 69.0 in | Wt 187.8 lb

## 2024-07-31 DIAGNOSIS — J849 Interstitial pulmonary disease, unspecified: Secondary | ICD-10-CM

## 2024-07-31 LAB — CBC WITH DIFFERENTIAL/PLATELET
Basophils Absolute: 0 10*3/uL (ref 0.0–0.1)
Basophils Relative: 0.5 % (ref 0.0–3.0)
Eosinophils Absolute: 0.3 10*3/uL (ref 0.0–0.7)
Eosinophils Relative: 4.4 % (ref 0.0–5.0)
HCT: 50.2 % (ref 39.0–52.0)
Hemoglobin: 17.5 g/dL — ABNORMAL HIGH (ref 13.0–17.0)
Lymphocytes Relative: 22.6 % (ref 12.0–46.0)
Lymphs Abs: 1.7 10*3/uL (ref 0.7–4.0)
MCHC: 34.9 g/dL (ref 30.0–36.0)
MCV: 90 fl (ref 78.0–100.0)
Monocytes Absolute: 0.5 10*3/uL (ref 0.1–1.0)
Monocytes Relative: 7.2 % (ref 3.0–12.0)
Neutro Abs: 4.8 10*3/uL (ref 1.4–7.7)
Neutrophils Relative %: 65.3 % (ref 43.0–77.0)
Platelets: 200 10*3/uL (ref 150.0–400.0)
RBC: 5.57 Mil/uL (ref 4.22–5.81)
RDW: 13.4 % (ref 11.5–15.5)
WBC: 7.4 10*3/uL (ref 4.0–10.5)

## 2024-07-31 LAB — HEPATIC FUNCTION PANEL
ALT: 31 U/L (ref 3–53)
AST: 34 U/L (ref 5–37)
Albumin: 4.6 g/dL (ref 3.5–5.2)
Alkaline Phosphatase: 87 U/L (ref 39–117)
Bilirubin, Direct: 0.4 mg/dL — ABNORMAL HIGH (ref 0.1–0.3)
Total Bilirubin: 1.5 mg/dL — ABNORMAL HIGH (ref 0.2–1.2)
Total Protein: 7.7 g/dL (ref 6.0–8.3)

## 2024-07-31 LAB — BASIC METABOLIC PANEL WITH GFR
BUN: 16 mg/dL (ref 6–23)
CO2: 28 meq/L (ref 19–32)
Calcium: 9.7 mg/dL (ref 8.4–10.5)
Chloride: 102 meq/L (ref 96–112)
Creatinine, Ser: 1.05 mg/dL (ref 0.40–1.50)
GFR: 75 mL/min
Glucose, Bld: 74 mg/dL (ref 70–99)
Potassium: 3.9 meq/L (ref 3.5–5.1)
Sodium: 140 meq/L (ref 135–145)

## 2024-07-31 NOTE — Patient Instructions (Addendum)
 Hi Mr. Christian Huynh  I will recommend the following: -CT chest scan to assess the changes on your lungs. -Pulmonary function test -Blood test to rule out any autoimmune condition -ILD questionnaire - please bring it at the next appt.   I will see you around 7 weeks.

## 2024-08-02 LAB — CK TOTAL AND CKMB (NOT AT ARMC)
CK, MB: 3.7 ng/mL (ref 0–5.0)
Relative Index: 1.9 (ref 0–4.0)
Total CK: 192 U/L (ref 22–308)

## 2024-08-02 LAB — RHEUMATOID FACTOR: Rheumatoid fact SerPl-aCnc: <10 [IU]/mL

## 2024-08-02 LAB — SJOGRENS SYNDROME-B EXTRACTABLE NUCLEAR ANTIBODY: SSB (La) (ENA) Antibody, IgG: <1 AI

## 2024-08-02 LAB — CYCLIC CITRUL PEPTIDE ANTIBODY, IGG: Cyclic Citrullin Peptide Ab: 26 U — ABNORMAL HIGH

## 2024-08-02 LAB — ANTI-DNA ANTIBODY, DOUBLE-STRANDED: ds DNA Ab: 3 [IU]/mL

## 2024-08-02 LAB — ANTI-SCLERODERMA ANTIBODY: Scleroderma (Scl-70) (ENA) Antibody, IgG: <1 AI

## 2024-08-02 LAB — SJOGRENS SYNDROME-A EXTRACTABLE NUCLEAR ANTIBODY: SSA (Ro) (ENA) Antibody, IgG: <1 AI

## 2024-08-02 LAB — ALDOLASE: Aldolase: 4 U/L

## 2024-08-05 ENCOUNTER — Ambulatory Visit (HOSPITAL_BASED_OUTPATIENT_CLINIC_OR_DEPARTMENT_OTHER)

## 2024-08-07 ENCOUNTER — Other Ambulatory Visit

## 2024-09-18 ENCOUNTER — Encounter

## 2024-09-23 ENCOUNTER — Ambulatory Visit: Admitting: Neurology

## 2024-10-03 ENCOUNTER — Encounter

## 2024-10-16 ENCOUNTER — Ambulatory Visit

## 2024-10-17 ENCOUNTER — Ambulatory Visit

## 2024-12-06 ENCOUNTER — Ambulatory Visit: Admitting: Physician Assistant
